# Patient Record
Sex: Female | Born: 1990 | Race: Black or African American | Hispanic: No | Marital: Single | Smoking: Current every day smoker
Health system: Southern US, Community
[De-identification: ages and names within clinical notes are randomized; demographics above are authoritative.]

## PROBLEM LIST (undated history)

## (undated) ENCOUNTER — Emergency Department (HOSPITAL_COMMUNITY): Payer: Medicaid Other

---

## 2019-03-17 ENCOUNTER — Inpatient Hospital Stay (HOSPITAL_COMMUNITY): Payer: Self-pay

## 2019-03-17 ENCOUNTER — Encounter (HOSPITAL_COMMUNITY): Payer: Self-pay | Admitting: Obstetrics and Gynecology

## 2019-03-17 ENCOUNTER — Other Ambulatory Visit: Payer: Self-pay

## 2019-03-17 ENCOUNTER — Inpatient Hospital Stay (HOSPITAL_COMMUNITY)
Admission: AD | Admit: 2019-03-17 | Discharge: 2019-03-17 | Disposition: A | Discharge status: Home / Self Care | Payer: Self-pay | Attending: Obstetrics and Gynecology | Admitting: Obstetrics and Gynecology

## 2019-03-17 DIAGNOSIS — O208 Other hemorrhage in early pregnancy: Secondary | ICD-10-CM | POA: Insufficient documentation

## 2019-03-17 DIAGNOSIS — O26899 Other specified pregnancy related conditions, unspecified trimester: Secondary | ICD-10-CM

## 2019-03-17 DIAGNOSIS — R102 Pelvic and perineal pain: Secondary | ICD-10-CM | POA: Insufficient documentation

## 2019-03-17 DIAGNOSIS — O209 Hemorrhage in early pregnancy, unspecified: Secondary | ICD-10-CM

## 2019-03-17 DIAGNOSIS — M545 Low back pain: Secondary | ICD-10-CM | POA: Insufficient documentation

## 2019-03-17 DIAGNOSIS — Z3A01 Less than 8 weeks gestation of pregnancy: Secondary | ICD-10-CM

## 2019-03-17 LAB — CBC
HCT: 36 % (ref 36.0–46.0)
Hemoglobin: 12.5 g/dL (ref 12.0–15.0)
MCH: 26.5 pg (ref 26.0–34.0)
MCHC: 34.7 g/dL (ref 30.0–36.0)
MCV: 76.4 fL — ABNORMAL LOW (ref 80.0–100.0)
Platelets: 268 10*3/uL (ref 150–400)
RBC: 4.71 MIL/uL (ref 3.87–5.11)
RDW: 15.3 % (ref 11.5–15.5)
WBC: 7.9 10*3/uL (ref 4.0–10.5)
nRBC: 0 % (ref 0.0–0.2)

## 2019-03-17 LAB — HCG, QUANTITATIVE, PREGNANCY: hCG, Beta Chain, Quant, S: 26191 m[IU]/mL — ABNORMAL HIGH (ref <5)

## 2019-03-17 LAB — ABO/RH: ABO/RH(D): A POS

## 2019-03-17 LAB — WET PREP, GENITAL
Sperm: NONE SEEN
Trich, Wet Prep: NONE SEEN
Yeast Wet Prep HPF POC: NONE SEEN

## 2019-03-17 LAB — POCT PREGNANCY, URINE: Preg Test, Ur: POSITIVE — AB

## 2019-03-17 LAB — HIV ANTIBODY (ROUTINE TESTING W REFLEX): HIV Screen 4th Generation wRfx: NONREACTIVE

## 2019-03-17 NOTE — Progress Notes (Signed)
Marie Williams CNM in earlier to discuss test results and d/c plan. WRitten and verbal d/c instructions given and understanding voiced. 

## 2019-03-17 NOTE — MAU Provider Note (Signed)
Chief Complaint: Vaginal Bleeding, Back Pain, and Possible Pregnancy   First Provider Initiated Contact with Patient 03/17/19 2125        SUBJECTIVE HPI: Toni Mitchell is a 29 y.o. No obstetric history on file. at Unknown by LMP who presents to maternity admissions reporting vaginal bleeding with low back pain in light of positive pregnancy test. . She denies vaginal itching/burning, urinary symptoms, h/a, dizziness, n/v, or fever/chills.    Vaginal Bleeding The patient's primary symptoms include vaginal bleeding. The patient's pertinent negatives include no genital itching, genital lesions, genital odor or pelvic pain. This is a new problem. The current episode started today. The problem occurs intermittently. The problem has been unchanged. The pain is mild. Associated symptoms include back pain. Pertinent negatives include no abdominal pain, chills, constipation, diarrhea, dysuria, fever, flank pain, headaches or vomiting. Associated symptoms comments: Back pain. The vaginal discharge was bloody. The vaginal bleeding is lighter than menses. She has not been passing clots. She has not been passing tissue. Nothing aggravates the symptoms. She has tried nothing for the symptoms.  Back Pain This is a new problem. The current episode started today. The problem occurs intermittently. The problem is unchanged. The pain is present in the lumbar spine. Pertinent negatives include no abdominal pain, dysuria, fever, headaches, numbness, paresis or pelvic pain. She has tried nothing for the symptoms.  Possible Pregnancy This is a new problem. The current episode started in the past 7 days. The problem occurs constantly. The problem has been unchanged. Pertinent negatives include no abdominal pain, chills, congestion, fever, headaches, neck pain, numbness or vomiting. Nothing aggravates the symptoms. She has tried nothing for the symptoms.   RN note: Came up from ED. Last wk had 3 +HPT, 2 this week.   Started bleeding last night.  Spots on pad, looked heavier in toilet today. Low back has been hurting since she missed her period  History reviewed. No pertinent past medical history. History reviewed. No pertinent surgical history. Social History   Socioeconomic History  . Marital status: Single    Spouse name: Not on file  . Number of children: Not on file  . Years of education: Not on file  . Highest education level: Not on file  Occupational History  . Not on file  Tobacco Use  . Smoking status: Not on file  Substance and Sexual Activity  . Alcohol use: Not on file  . Drug use: Not on file  . Sexual activity: Not on file  Other Topics Concern  . Not on file  Social History Narrative  . Not on file   Social Determinants of Health   Financial Resource Strain:   . Difficulty of Paying Living Expenses: Not on file  Food Insecurity:   . Worried About Programme researcher, broadcasting/film/video in the Last Year: Not on file  . Ran Out of Food in the Last Year: Not on file  Transportation Needs:   . Lack of Transportation (Medical): Not on file  . Lack of Transportation (Non-Medical): Not on file  Physical Activity:   . Days of Exercise per Week: Not on file  . Minutes of Exercise per Session: Not on file  Stress:   . Feeling of Stress : Not on file  Social Connections:   . Frequency of Communication with Friends and Family: Not on file  . Frequency of Social Gatherings with Friends and Family: Not on file  . Attends Religious Services: Not on file  . Active Member of  Clubs or Organizations: Not on file  . Attends Archivist Meetings: Not on file  . Marital Status: Not on file  Intimate Partner Violence:   . Fear of Current or Ex-Partner: Not on file  . Emotionally Abused: Not on file  . Physically Abused: Not on file  . Sexually Abused: Not on file   No current facility-administered medications on file prior to encounter.   No current outpatient medications on file prior to  encounter.   Not on File  I have reviewed patient's Past Medical Hx, Surgical Hx, Family Hx, Social Hx, medications and allergies.   ROS:  Review of Systems  Constitutional: Negative for chills and fever.  HENT: Negative for congestion.   Gastrointestinal: Negative for abdominal pain, constipation, diarrhea and vomiting.  Genitourinary: Positive for vaginal bleeding. Negative for dysuria, flank pain and pelvic pain.  Musculoskeletal: Positive for back pain. Negative for neck pain.  Neurological: Negative for numbness and headaches.   Review of Systems  Other systems negative   Physical Exam  Physical Exam Patient Vitals for the past 24 hrs:  BP Temp Temp src Pulse Resp SpO2 Height Weight  03/17/19 2325 117/75 -- -- (!) 107 16 -- -- --  03/17/19 1844 122/68 98.9 F (37.2 C) Oral 99 16 99 % 5\' 6"  (1.676 m) 56.6 kg  03/17/19 1811 122/86 98.4 F (36.9 C) Oral (!) 104 16 100 % -- --   Constitutional: Well-developed, well-nourished female in no acute distress.  Cardiovascular: normal rate Respiratory: normal effort GI: Abd soft, non-tender. Pos BS x 4 MS: Extremities nontender, no edema, normal ROM Neurologic: Alert and oriented x 4.  GU: Neg CVAT.  PELVIC EXAM: Cervix pink, visually closed, without lesion, scant red discharge, vaginal walls and external genitalia normal Bimanual exam: Cervix 0/long/high, firm, anterior, neg CMT, uterus nontender, nonenlarged, adnexa without tenderness, enlargement, or mass   LAB RESULTS Results for orders placed or performed during the hospital encounter of 03/17/19 (from the past 24 hour(s))  Pregnancy, urine POC     Status: Abnormal   Collection Time: 03/17/19  7:40 PM  Result Value Ref Range   Preg Test, Ur POSITIVE (A) NEGATIVE  hCG, quantitative, pregnancy     Status: Abnormal   Collection Time: 03/17/19  8:12 PM  Result Value Ref Range   hCG, Beta Chain, Quant, S 26,191 (H) <5 mIU/mL  CBC     Status: Abnormal   Collection Time:  03/17/19  8:12 PM  Result Value Ref Range   WBC 7.9 4.0 - 10.5 K/uL   RBC 4.71 3.87 - 5.11 MIL/uL   Hemoglobin 12.5 12.0 - 15.0 g/dL   HCT 36.0 36.0 - 46.0 %   MCV 76.4 (L) 80.0 - 100.0 fL   MCH 26.5 26.0 - 34.0 pg   MCHC 34.7 30.0 - 36.0 g/dL   RDW 15.3 11.5 - 15.5 %   Platelets 268 150 - 400 K/uL   nRBC 0.0 0.0 - 0.2 %  HIV Antibody (routine testing w rflx)     Status: None   Collection Time: 03/17/19  8:12 PM  Result Value Ref Range   HIV Screen 4th Generation wRfx NON REACTIVE NON REACTIVE  ABO/Rh     Status: None   Collection Time: 03/17/19  8:12 PM  Result Value Ref Range   ABO/RH(D) A POS    No rh immune globuloin      NOT A RH IMMUNE GLOBULIN CANDIDATE, PT RH POSITIVE Performed at [facility]  Lab, [address] [place], [place] [address]   Wet prep, genital     Status: Abnormal   Collection Time: 03/17/19  9:35 PM   Specimen: Vaginal  Result Value Ref Range   Yeast Wet Prep HPF POC NONE SEEN NONE SEEN   Trich, Wet Prep NONE SEEN NONE SEEN   Clue Cells Wet Prep HPF POC PRESENT (A) NONE SEEN   WBC, Wet Prep HPF POC MANY (A) NONE SEEN   Sperm NONE SEEN     --/--/A POS (01/28 2012)  IMAGING US OB Comp Less 14 Wks  Result Date: 03/17/2019 CLINICAL DATA:  Pelvic pain, bleeding EXAM: OBSTETRIC <14 WK [place] AND TRANSVAGINAL OB US TECHNIQUE: Both transabdominal and transvaginal ultrasound examinations were performed for complete evaluation of the gestation as well as the maternal uterus, adnexal regions, and pelvic cul-de-sac. Transvaginal technique was performed to assess early pregnancy. COMPARISON:  None. FINDINGS: Intrauterine gestational sac: Single Yolk sac:  Visualized Embryo:  Not visualized Cardiac Activity: Not visualized Heart Rate:   bpm MSD: 12.1 mm   6 w   0 d CRL:    mm    w    d                  [place] EDC: Subchorionic hemorrhage:  Moderate subchorionic hemorrhage. Maternal uterus/adnexae: No adnexal mass or free fluid. IMPRESSION: Six week intrauterine  gestational sac with yolk sac present, but no fetal pole. Follow-up ultrasound in 14 days recommended to ensure expected progression. Moderate subchorionic hemorrhage. Electronically Signed   By: Charlett Nose M.D.   On: 03/17/2019 22:33   US OB Transvaginal  Result Date: 03/17/2019 CLINICAL DATA:  Pelvic pain, bleeding EXAM: OBSTETRIC <14 WK [place] AND TRANSVAGINAL OB US TECHNIQUE: Both transabdominal and transvaginal ultrasound examinations were performed for complete evaluation of the gestation as well as the maternal uterus, adnexal regions, and pelvic cul-de-sac. Transvaginal technique was performed to assess early pregnancy. COMPARISON:  None. FINDINGS: Intrauterine gestational sac: Single Yolk sac:  Visualized Embryo:  Not visualized Cardiac Activity: Not visualized Heart Rate:   bpm MSD: 12.1 mm   6 w   0 d CRL:    mm    w    d                  [place] EDC: Subchorionic hemorrhage:  Moderate subchorionic hemorrhage. Maternal uterus/adnexae: No adnexal mass or free fluid. IMPRESSION: Six week intrauterine gestational sac with yolk sac present, but no fetal pole. Follow-up ultrasound in 14 days recommended to ensure expected progression. Moderate subchorionic hemorrhage. Electronically Signed   By: Charlett Nose M.D.   On: 03/17/2019 22:33    MAU Management/MDM: Ordered usual first trimester r/o ectopic labs.   Pelvic exam and cultures done Will check baseline Ultrasound to rule out ectopic.  This bleeding/pain can represent a normal pregnancy with bleeding, spontaneous abortion or even an ectopic which can be life-threatening.  The process as listed above helps to determine which of these is present.  Reviewed findings.  Will schedule [place] for followup in about 10 days for viability.  SAB precautions  ASSESSMENT 1. Vaginal bleeding in pregnancy, first trimester   2. Pelvic pain affecting pregnancy     PLAN Discharge home Will repeat  Ultrasound in about 7-10 days   Follow-up Information     [facility] Follow up.   Specialty: Obstetrics and Gynecology Contact information: [address] [place] [address], [address] [id number] mc  [facility] [place] [address] [phone]       [facility] Follow up.   Specialty: Radiology Why: someone will call Contact information: [address], [address] [id number] mc [place] [address] [phone]         Pt stable at time of discharge. Encouraged to return here or to other Urgent Care/ED if she develops worsening of symptoms, increase in pain, fever, or other concerning symptoms.    Wynelle Bourgeois CNM, MSN Certified Nurse-Midwife 03/17/2019  11:36 PM

## 2019-03-17 NOTE — MAU Note (Signed)
Unable to void

## 2019-03-17 NOTE — Discharge Instructions (Signed)

## 2019-03-17 NOTE — MAU Note (Signed)
Came up from ED. Last wk had 3 +HPT, 2 this week.  Started bleeding last night.  Spots on pad, looked heavier in toilet today. Low back has been hurting since she missed her period.

## 2019-03-21 LAB — GC/CHLAMYDIA PROBE AMP (~~LOC~~) NOT AT ARMC
Chlamydia: NEGATIVE
Comment: NEGATIVE
Comment: NORMAL
Neisseria Gonorrhea: NEGATIVE

## 2019-03-24 ENCOUNTER — Other Ambulatory Visit: Payer: Self-pay

## 2019-03-24 ENCOUNTER — Ambulatory Visit (HOSPITAL_COMMUNITY)
Admission: RE | Admit: 2019-03-24 | Discharge: 2019-03-24 | Disposition: A | Discharge status: Home / Self Care | Payer: Self-pay | Source: Ambulatory Visit | Attending: Advanced Practice Midwife | Admitting: Advanced Practice Midwife

## 2019-03-24 ENCOUNTER — Ambulatory Visit (INDEPENDENT_AMBULATORY_CARE_PROVIDER_SITE_OTHER): Payer: Self-pay

## 2019-03-24 ENCOUNTER — Encounter: Payer: Self-pay | Admitting: Family Medicine

## 2019-03-24 DIAGNOSIS — O209 Hemorrhage in early pregnancy, unspecified: Secondary | ICD-10-CM | POA: Insufficient documentation

## 2019-03-24 DIAGNOSIS — O26899 Other specified pregnancy related conditions, unspecified trimester: Secondary | ICD-10-CM | POA: Insufficient documentation

## 2019-03-24 DIAGNOSIS — R102 Pelvic and perineal pain: Secondary | ICD-10-CM | POA: Insufficient documentation

## 2019-03-24 DIAGNOSIS — Z3491 Encounter for supervision of normal pregnancy, unspecified, first trimester: Secondary | ICD-10-CM

## 2019-03-24 NOTE — Progress Notes (Signed)
Agree with A & P. 

## 2019-03-24 NOTE — Progress Notes (Signed)
Pt here today for OB US results.  US shows viable pregnancy with moderate subchronic hemorrhage.  FHR 120 with EDD 11/16/19 and 6w 1d today.   [place] pics given to pt.  Medications/allergies reviewed.  Proof of pregnancy letter provided by front office for pt to begin prenatal care.    Addison Naegeli, RN 03/24/19

## 2019-09-19 ENCOUNTER — Emergency Department (HOSPITAL_COMMUNITY)
Admission: EM | Admit: 2019-09-19 | Discharge: 2019-09-19 | Disposition: A | Discharge status: Home / Self Care | Payer: Self-pay | Attending: Emergency Medicine | Admitting: Emergency Medicine

## 2019-09-19 ENCOUNTER — Other Ambulatory Visit: Payer: Self-pay

## 2019-09-19 ENCOUNTER — Emergency Department (HOSPITAL_COMMUNITY): Payer: Self-pay

## 2019-09-19 DIAGNOSIS — R102 Pelvic and perineal pain: Secondary | ICD-10-CM | POA: Insufficient documentation

## 2019-09-19 DIAGNOSIS — R109 Unspecified abdominal pain: Secondary | ICD-10-CM

## 2019-09-19 DIAGNOSIS — M545 Low back pain: Secondary | ICD-10-CM | POA: Insufficient documentation

## 2019-09-19 DIAGNOSIS — R300 Dysuria: Secondary | ICD-10-CM | POA: Insufficient documentation

## 2019-09-19 DIAGNOSIS — N898 Other specified noninflammatory disorders of vagina: Secondary | ICD-10-CM | POA: Insufficient documentation

## 2019-09-19 DIAGNOSIS — R110 Nausea: Secondary | ICD-10-CM | POA: Insufficient documentation

## 2019-09-19 DIAGNOSIS — N72 Inflammatory disease of cervix uteri: Secondary | ICD-10-CM | POA: Insufficient documentation

## 2019-09-19 LAB — URINALYSIS, ROUTINE W REFLEX MICROSCOPIC
Bacteria, UA: NONE SEEN
Bilirubin Urine: NEGATIVE
Glucose, UA: NEGATIVE mg/dL
Ketones, ur: NEGATIVE mg/dL
Leukocytes,Ua: NEGATIVE
Nitrite: NEGATIVE
Protein, ur: NEGATIVE mg/dL
Specific Gravity, Urine: 1.035 — ABNORMAL HIGH (ref 1.005–1.030)
pH: 5 (ref 5.0–8.0)

## 2019-09-19 LAB — CBC
HCT: 33.5 % — ABNORMAL LOW (ref 36.0–46.0)
Hemoglobin: 11.4 g/dL — ABNORMAL LOW (ref 12.0–15.0)
MCH: 27.5 pg (ref 26.0–34.0)
MCHC: 34 g/dL (ref 30.0–36.0)
MCV: 80.9 fL (ref 80.0–100.0)
Platelets: 228 10*3/uL (ref 150–400)
RBC: 4.14 MIL/uL (ref 3.87–5.11)
RDW: 15.3 % (ref 11.5–15.5)
WBC: 8.1 10*3/uL (ref 4.0–10.5)
nRBC: 0 % (ref 0.0–0.2)

## 2019-09-19 LAB — COMPREHENSIVE METABOLIC PANEL WITH GFR
ALT: 13 U/L (ref 0–44)
AST: 15 U/L (ref 15–41)
Albumin: 3.8 g/dL (ref 3.5–5.0)
Alkaline Phosphatase: 48 U/L (ref 38–126)
Anion gap: 9 (ref 5–15)
BUN: 8 mg/dL (ref 6–20)
CO2: 22 mmol/L (ref 22–32)
Calcium: 8.8 mg/dL — ABNORMAL LOW (ref 8.9–10.3)
Chloride: 108 mmol/L (ref 98–111)
Creatinine, Ser: 0.81 mg/dL (ref 0.44–1.00)
GFR calc Af Amer: >60 mL/min
GFR calc non Af Amer: >60 mL/min
Glucose, Bld: 108 mg/dL — ABNORMAL HIGH (ref 70–99)
Potassium: 3.4 mmol/L — ABNORMAL LOW (ref 3.5–5.1)
Sodium: 139 mmol/L (ref 135–145)
Total Bilirubin: 0.2 mg/dL — ABNORMAL LOW (ref 0.3–1.2)
Total Protein: 6.5 g/dL (ref 6.5–8.1)

## 2019-09-19 LAB — WET PREP, GENITAL
Clue Cells Wet Prep HPF POC: NONE SEEN
Sperm: NONE SEEN
Trich, Wet Prep: NONE SEEN
Yeast Wet Prep HPF POC: NONE SEEN

## 2019-09-19 LAB — LIPASE, BLOOD: Lipase: 21 U/L (ref 11–51)

## 2019-09-19 LAB — I-STAT BETA HCG BLOOD, ED (MC, WL, AP ONLY): I-stat hCG, quantitative: <5 m[IU]/mL (ref <5)

## 2019-09-19 MED ORDER — LIDOCAINE HCL (PF) 1 % IJ SOLN
INTRAMUSCULAR | Status: AC
  Administered 2019-09-19: 1 mL
  Filled 2019-09-19: qty 5

## 2019-09-19 MED ORDER — KETOROLAC TROMETHAMINE 60 MG/2ML IM SOLN
60.0000 mg | Freq: Once | INTRAMUSCULAR | Status: AC
  Administered 2019-09-19: 60 mg via INTRAMUSCULAR
  Filled 2019-09-19: qty 2

## 2019-09-19 MED ORDER — POTASSIUM CHLORIDE CRYS ER 20 MEQ PO TBCR
40.0000 meq | EXTENDED_RELEASE_TABLET | Freq: Once | ORAL | Status: AC
  Administered 2019-09-19: 40 meq via ORAL
  Filled 2019-09-19: qty 2

## 2019-09-19 MED ORDER — CEFTRIAXONE SODIUM 500 MG IJ SOLR
500.0000 mg | Freq: Once | INTRAMUSCULAR | Status: AC
  Administered 2019-09-19: 500 mg via INTRAMUSCULAR
  Filled 2019-09-19: qty 500

## 2019-09-19 MED ORDER — DOXYCYCLINE HYCLATE 100 MG PO TABS
100.0000 mg | ORAL_TABLET | Freq: Once | ORAL | Status: AC
  Administered 2019-09-19: 100 mg via ORAL
  Filled 2019-09-19: qty 1

## 2019-09-19 MED ORDER — DOXYCYCLINE HYCLATE 100 MG PO CAPS
100.0000 mg | ORAL_CAPSULE | Freq: Two times a day (BID) | ORAL | 0 refills | Status: AC
Start: 2019-09-19 — End: 2019-09-26

## 2019-09-19 NOTE — Discharge Instructions (Signed)
You have been treated today for an STD.   Take antibiotics as directed. Please take all of your antibiotics until finished.  The test results with take 2-3 days to return. If there is an abnormal result, you will be notified. If you do not hear anything, that means the results were negative. You can also log on MyChart to see the results.   Your sexual partner needs to be treated too. Do not have sexual intercourse for the next 7 days and after your partner has been treated.   Your urine has been sent for culture.  If it is positive for infection, you will be notified  Follow-up with your primary care doctor in 2-4 days. If you do not have a primary care doctor, you can use one listed in the paperwork.   Return to the Emergency Department for any fever, abdominal pain, difficulty breathing, nausea/vomiting or any other worsening or concerning symptoms.

## 2019-09-19 NOTE — ED Notes (Signed)
Discharge papers discussed; pt educated about abx and need to finish entire course of abx. Pt verbalized understanding. Pt and VS stable upon departure.

## 2019-09-19 NOTE — ED Triage Notes (Signed)
Pt reports generalized abdominal pain with nausea and back pain x several days and headache that started today. Reports burning with urination.

## 2019-09-19 NOTE — ED Provider Notes (Signed)
[facility] EMERGENCY DEPARTMENT Provider Note   CSN: [id number] Arrival date & time: 09/19/19  1624     History Chief Complaint  Patient presents with  . Abdominal Pain    Toni Mitchell is a 29 y.o. female who presents for evaluation of abdominal pain and dysuria that has been ongoing for last few days.  She states that she has some irritation in the vaginal area, particular when she urinates.  She feels like it is irritated.  She has not seen any rash.  She states that she has a little bit of discharge which he states is common for her before her cycle.  Her LMP was 09/01/2019.  No vaginal bleeding.  She reports that she has had some lower abdominal pain.  She states it has been constant over the last few days.  She also is reported some lower back pain.  She describes it as a cramping type situation.  She has been taking ibuprofen which has not been helping.  She has also been applying heat.  She has had some nausea but no vomiting.  She states that she has not had any fevers, diarrhea, constipation, chest pain, difficulty breathing.  She is currently sexually active with 1 partner.  They do not use protection.  The history is provided by the patient.       No past medical history on file.  There are no problems to display for this patient.   No past surgical history on file.   OB History   No obstetric history on file.     No family history on file.  Social History   Tobacco Use  . Smoking status: Not on file  Substance Use Topics  . Alcohol use: Not on file  . Drug use: Not on file    Home Medications Prior to Admission medications   Medication Sig Start Date End Date Taking? Authorizing Provider  saccharomyces boulardii (FLORASTOR) 250 MG capsule Take 500 mg by mouth daily.   Yes [provider]  doxycycline (VIBRAMYCIN) 100 MG capsule Take 1 capsule (100 mg total) by mouth 2 (two) times daily for 7 days. 09/19/19 09/26/19  Maxwell Caul, PA-C    Allergies    Shellfish allergy  Review of Systems   Review of Systems  Constitutional: Negative for fever.  Respiratory: Negative for cough and shortness of breath.   Cardiovascular: Negative for chest pain.  Gastrointestinal: Positive for abdominal pain. Negative for nausea and vomiting.  Genitourinary: Positive for dysuria and vaginal discharge. Negative for hematuria.  Neurological: Negative for headaches.  All other systems reviewed and are negative.   Physical Exam Updated Vital Signs BP 116/83 (BP Location: Left Arm)   Pulse (!) 59   Temp 98.2 F (36.8 C) (Oral)   Resp 16   Ht 5\' 6"  (1.676 m)   Wt 59 kg   LMP 09/01/2019 (Exact Date)   SpO2 100%   BMI 20.98 kg/m   Physical Exam Vitals and nursing note reviewed. Exam conducted with a chaperone present.  Constitutional:      Appearance: Normal appearance. She is well-developed.  HENT:     Head: Normocephalic and atraumatic.  Eyes:     General: Lids are normal.     Conjunctiva/sclera: Conjunctivae normal.     Pupils: Pupils are equal, round, and reactive to light.  Cardiovascular:     Rate and Rhythm: Normal rate and regular rhythm.     Pulses: Normal pulses.  Heart sounds: Normal heart sounds. No murmur heard.  No friction rub. No gallop.   Pulmonary:     Effort: Pulmonary effort is normal.     Breath sounds: Normal breath sounds.     Comments: Lungs clear to auscultation bilaterally.  Symmetric chest rise.  No wheezing, rales, rhonchi. Abdominal:     Palpations: Abdomen is soft. Abdomen is not rigid.     Tenderness: There is abdominal tenderness in the suprapubic area. There is right CVA tenderness and left CVA tenderness. There is no guarding.     Comments: Abdomen is soft, non-distended.  Mild tenderness noted suprapubic region.  No rigidity, guarding.  Mild CVA tenderness noted bilaterally.  No rigidity, No guarding. No peritoneal signs.  Genitourinary:    Vagina: Vaginal discharge  present.     Cervix: Friability and erythema present. No cervical motion tenderness.     Adnexa:        Right: No mass or tenderness.         Left: No mass.       Comments: The exam was performed with a chaperone present. Normal external female genitalia. No lesions, rash, or sores.  Mild cervical erythema, friability.  No CMT.  Thick white discharge noted in vaginal vault.  No adnexal mass or tenderness noted bilaterally. Musculoskeletal:        General: Normal range of motion.     Cervical back: Full passive range of motion without pain.  Skin:    General: Skin is warm and dry.     Capillary Refill: Capillary refill takes less than 2 seconds.  Neurological:     Mental Status: She is alert and oriented to person, place, and time.  Psychiatric:        Speech: Speech normal.     ED Results / Procedures / Treatments   Labs (all labs ordered are listed, but only abnormal results are displayed) Labs Reviewed  WET PREP, GENITAL - Abnormal; Notable for the following components:      Result Value   WBC, Wet Prep HPF POC MODERATE (*)    All other components within normal limits  COMPREHENSIVE METABOLIC PANEL - Abnormal; Notable for the following components:   Potassium 3.4 (*)    Glucose, Bld 108 (*)    Calcium 8.8 (*)    Total Bilirubin 0.2 (*)    All other components within normal limits  CBC - Abnormal; Notable for the following components:   Hemoglobin 11.4 (*)    HCT 33.5 (*)    All other components within normal limits  URINALYSIS, ROUTINE W REFLEX MICROSCOPIC - Abnormal; Notable for the following components:   Specific Gravity, Urine 1.035 (*)    Hgb urine dipstick SMALL (*)    All other components within normal limits  URINE CULTURE  LIPASE, BLOOD  I-STAT BETA HCG BLOOD, ED ([facility], [facility], [facility] ONLY)  GC/CHLAMYDIA PROBE AMP ([place]) NOT AT [facility]    EKG None  Radiology CT Renal Stone Study  Result Date: 09/19/2019 CLINICAL DATA:  Left flank pain, stone disease suspected  EXAM: CT ABDOMEN AND PELVIS WITHOUT CONTRAST TECHNIQUE: Multidetector CT imaging of the abdomen and pelvis was performed following the standard protocol without IV contrast. COMPARISON:  Obstetrical ultrasound, most recently 03/24/2019 FINDINGS: Lower chest: Lung bases are clear. Normal heart size. No pericardial effusion. Hepatobiliary: No visible focal liver lesion on this unenhanced CT. Normal liver attenuation. Smooth liver surface contour. Gallbladder partially decompressed but otherwise unremarkable. No biliary ductal dilatation.  No visible calcified gallstones. Pancreas: Unremarkable. No pancreatic ductal dilatation or surrounding inflammatory changes. Spleen: Normal in size. No concerning splenic lesions. Adrenals/Urinary Tract: Normal adrenal glands. No visible or contour deforming worrisome renal lesions. No urolithiasis or hydronephrosis is evident. No visible calcifications along the course of either ureter. Urinary bladder is largely decompressed at the time of exam and therefore poorly evaluated by CT imaging. No gross bladder abnormality, visible bladder or urethral calculi are identified Stomach/Bowel: Distal esophagus, stomach and duodenal sweep are unremarkable. No small bowel wall thickening or dilatation. Fecalization of the distal small bowel contents without evidence of mechanical obstruction. No colonic dilatation or wall thickening. A normal appendix is visualized. Vascular/Lymphatic: No significant vascular findings are present. No enlarged abdominal or pelvic lymph nodes. Reproductive: Anteverted uterus. Normal follicles present in both ovaries including a dominant follicle in the right ovary. No concerning adnexal lesions. Other: No abdominopelvic free fluid or free gas. No bowel containing hernias. Musculoskeletal: No acute osseous abnormality or suspicious osseous lesion. 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 Nonspecific. IMPRESSION: 1. No acute urinary tract abnormality is  evident. Specifically no evidence of urolithiasis or hydronephrosis. 2. Fecalization of the distal small bowel contents without evidence of mechanical obstruction. Findings are nonspecific but can be seen with slowed intestinal transit/constipation. 3. 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 Electronically Signed   By: Kreg ShropshirePrice  DeHay M.D.   On: 09/19/2019 21:59    Procedures Procedures (including critical care time)  Medications Ordered in ED Medications  potassium chloride SA (KLOR-CON) CR tablet 40 mEq (has no administration in time range)  cefTRIAXone (ROCEPHIN) injection 500 mg (has no administration in time range)  doxycycline (VIBRA-TABS) tablet 100 mg (has no administration in time range)  ketorolac (TORADOL) injection 60 mg (60 mg Intramuscular Given 09/19/19 2135)    ED Course  I have reviewed the triage vital signs and the nursing notes.  Pertinent labs & imaging results that were available during my care of the patient were reviewed by me and considered in my medical decision making (see chart for details).    MDM Rules/Calculators/A&P                          29 year old female who presents for evaluation of dysuria, vaginal irritation, vaginal discharge, abdominal pain that is been ongoing for the last few days.  Associated with some nausea.  No fevers, vomiting.  On initially arrival, she is afebrile, nontoxic-appearing.  Vital signs are stable.  On exam, she has some mild suprapubic abdominal tenderness as well as some mild CVA tenderness.  Concern for GU etiology versus infectious etiology.  History/physical exam not concerning for ovarian torsion.  Plan to check labs.  We will also obtain pelvic.  I-STAT beta negative.  UA shows small hemoglobin.  CMP shows potassium of 3.4.  BUN creatinine within normal limits.  Lipase is unremarkable.  CBC shows no leukocytosis.  Hemoglobin stable at 11.4.  We will plan to send urine for culture. Given hgb in urine plan for renal  study for eval stones.   Pelvic exam as documented above.  Patient tolerated procedure well.  Exam not concerning for PID.  She did have some cervical erythema and friability as well as discharge noted in the vaginal vault.  Wet prep and gonorrhea/chlamydia sent. No indication for U/S.   CT unremarkable for any evidence of kidney stones.  No evidence of inflammation surrounding kidney symmetry concerning for pad of  redness.  Given that she is symptomatic, urine sent for culture.  I discussed results with patient.  She is resting comfortably.  She states that Toradol helped her pain here in the department.  Patient states she is ready to go home.  I discussed with patient regarding her pelvic exam and pending gonorrhea/chlamydia cultures.  Patient would like to opt for treatment.  Patient with no known drug allergies.  Plan for treatment for gonorrhea/chlamydria. At this time, patient exhibits no emergent life-threatening condition that require further evaluation in ED or admission. Patient had ample opportunity for questions and discussion. All patient's questions were answered with full understanding. Strict return precautions discussed. Patient expresses understanding and agreement to plan.   Portions of this note were generated with Scientist, clinical (histocompatibility and immunogenetics). Dictation errors may occur despite best attempts at proofreading.    Final Clinical Impression(s) / ED Diagnoses Final diagnoses:  Abdominal pain, unspecified abdominal location    Rx / DC Orders ED Discharge Orders         Ordered    doxycycline (VIBRAMYCIN) 100 MG capsule  2 times daily     Discontinue  Reprint     09/19/19 2212           Maxwell Caul, PA-C 09/19/19 2216    Rolan Bucco, MD 09/19/19 [phone]

## 2019-09-20 LAB — GC/CHLAMYDIA PROBE AMP (~~LOC~~) NOT AT ARMC
Chlamydia: NEGATIVE
Comment: NEGATIVE
Comment: NORMAL
Neisseria Gonorrhea: NEGATIVE

## 2019-09-22 LAB — URINE CULTURE: Culture: 20000 — AB

## 2019-09-24 ENCOUNTER — Telehealth: Payer: Self-pay | Admitting: Emergency Medicine

## 2019-09-24 NOTE — Telephone Encounter (Signed)
Post ED Visit - Positive Culture Follow-up  Culture report reviewed by antimicrobial stewardship pharmacist: [facility] []  , Pharm.D. []  Enzo Bi, Pharm.D., BCPS AQ-ID []  , Pharm.D., BCPS []  Celedonio Miyamoto, Pharm.D., BCPS []  [place], Garvin Fila.D., BCPS, AAHIVP []  , Pharm.D., BCPS, AAHIVP []  Georgina Pillion, PharmD, BCPS []  , PharmD, BCPS []  [place], PharmD, BCPS [x]  [address], PharmD []  , PharmD, BCPS []  Estella Husk, PharmD  Pharmacy Team []  Lysle Pearl, PharmD []  , PharmD []  Phillips Climes, PharmD []  , Rph []  Agapito Games) , PharmD []  Lamar Sprinkles, PharmD []  , PharmD []  Mervyn Gay, PharmD []  , PharmD []  Vinnie Level, PharmD []  W[facility], PharmD []  , PharmD []  Len Childs, PharmD   Positive urine culture Treated with Doxycycline, organism sensitive to the same and no further patient follow-up is required at this time.  Toni Mitchell 09/24/2019, 6:10 PM

## 2020-06-15 ENCOUNTER — Emergency Department (HOSPITAL_COMMUNITY)
Admission: EM | Admit: 2020-06-15 | Discharge: 2020-06-16 | Disposition: A | Discharge status: Home / Self Care | Payer: Medicaid Other | Attending: Emergency Medicine | Admitting: Emergency Medicine

## 2020-06-15 ENCOUNTER — Other Ambulatory Visit: Payer: Self-pay

## 2020-06-15 DIAGNOSIS — R21 Rash and other nonspecific skin eruption: Secondary | ICD-10-CM | POA: Diagnosis not present

## 2020-06-15 DIAGNOSIS — R1084 Generalized abdominal pain: Secondary | ICD-10-CM | POA: Diagnosis not present

## 2020-06-15 DIAGNOSIS — N898 Other specified noninflammatory disorders of vagina: Secondary | ICD-10-CM | POA: Diagnosis present

## 2020-06-15 DIAGNOSIS — B9689 Other specified bacterial agents as the cause of diseases classified elsewhere: Secondary | ICD-10-CM | POA: Diagnosis not present

## 2020-06-15 DIAGNOSIS — N760 Acute vaginitis: Secondary | ICD-10-CM | POA: Diagnosis not present

## 2020-06-15 LAB — URINALYSIS, ROUTINE W REFLEX MICROSCOPIC
Bacteria, UA: NONE SEEN
Bilirubin Urine: NEGATIVE
Glucose, UA: NEGATIVE mg/dL
Ketones, ur: NEGATIVE mg/dL
Leukocytes,Ua: NEGATIVE
Nitrite: NEGATIVE
Protein, ur: 30 mg/dL — AB
Specific Gravity, Urine: 1.032 — ABNORMAL HIGH (ref 1.005–1.030)
pH: 5 (ref 5.0–8.0)

## 2020-06-15 LAB — CBC WITH DIFFERENTIAL/PLATELET
Abs Immature Granulocytes: 0.02 10*3/uL (ref 0.00–0.07)
Basophils Absolute: 0 10*3/uL (ref 0.0–0.1)
Basophils Relative: 0 %
Eosinophils Absolute: 0.4 10*3/uL (ref 0.0–0.5)
Eosinophils Relative: 4 %
HCT: 38.4 % (ref 36.0–46.0)
Hemoglobin: 13.6 g/dL (ref 12.0–15.0)
Immature Granulocytes: 0 %
Lymphocytes Relative: 39 %
Lymphs Abs: 3.5 10*3/uL (ref 0.7–4.0)
MCH: 30.8 pg (ref 26.0–34.0)
MCHC: 35.4 g/dL (ref 30.0–36.0)
MCV: 86.9 fL (ref 80.0–100.0)
Monocytes Absolute: 0.8 10*3/uL (ref 0.1–1.0)
Monocytes Relative: 8 %
Neutro Abs: 4.3 10*3/uL (ref 1.7–7.7)
Neutrophils Relative %: 49 %
Platelets: 246 10*3/uL (ref 150–400)
RBC: 4.42 MIL/uL (ref 3.87–5.11)
RDW: 13 % (ref 11.5–15.5)
WBC: 8.9 10*3/uL (ref 4.0–10.5)
nRBC: 0 % (ref 0.0–0.2)

## 2020-06-15 LAB — COMPREHENSIVE METABOLIC PANEL
ALT: 21 U/L (ref 0–44)
AST: 20 U/L (ref 15–41)
Albumin: 4.2 g/dL (ref 3.5–5.0)
Alkaline Phosphatase: 56 U/L (ref 38–126)
Anion gap: 7 (ref 5–15)
BUN: 10 mg/dL (ref 6–20)
CO2: 25 mmol/L (ref 22–32)
Calcium: 9.1 mg/dL (ref 8.9–10.3)
Chloride: 103 mmol/L (ref 98–111)
Creatinine, Ser: 0.73 mg/dL (ref 0.44–1.00)
GFR, Estimated: >60 mL/min (ref >60)
Glucose, Bld: 84 mg/dL (ref 70–99)
Potassium: 3.7 mmol/L (ref 3.5–5.1)
Sodium: 135 mmol/L (ref 135–145)
Total Bilirubin: 0.6 mg/dL (ref 0.3–1.2)
Total Protein: 7.2 g/dL (ref 6.5–8.1)

## 2020-06-15 LAB — I-STAT BETA HCG BLOOD, ED (MC, WL, AP ONLY): I-stat hCG, quantitative: <5 m[IU]/mL (ref <5)

## 2020-06-15 LAB — HIV ANTIBODY (ROUTINE TESTING W REFLEX): HIV Screen 4th Generation wRfx: NONREACTIVE

## 2020-06-15 NOTE — ED Triage Notes (Signed)
Emergency Medicine Provider Triage Evaluation Note  Toni Mitchell , a 30 y.o. female  was evaluated in triage.  Pt complains of abd pain, vag irritation, rash.  Review of Systems  Positive: abd pain, vaginal irritation, rash Negative: Fever, urinary sxs  Physical Exam  BP 121/89 (BP Location: Left Arm)   Pulse 88   Temp 98 F (36.7 C) (Oral)   Resp 18   Ht 5\' 6"  (1.676 m)   Wt 62.1 kg   SpO2 100%   BMI 22.11 kg/m  Gen:   Awake, no distress, eczematous rash to the posterior right knee HEENT:  Atraumatic  Resp:  Normal effort  Cardiac:  Normal rate  Abd:   Nondistended, nontender  MSK:   Moves extremities without difficulty  Neuro:  Speech clear   Medical Decision Making  Medically screening exam initiated at 9:00 PM.  Appropriate orders placed.  was informed that the remainder of the evaluation will be completed by another provider, this initial triage assessment does not replace that evaluation, and the importance of remaining in the ED until their evaluation is complete.  Clinical Impression   MSE was initiated and I personally evaluated the patient and placed orders (if any) at  9:00 PM on June 15, 2020.  The patient appears stable so that the remainder of the MSE may be completed by another provider.    Jun 17, 2020, PA-C 06/15/20 2100

## 2020-06-16 LAB — WET PREP, GENITAL
Sperm: NONE SEEN
Trich, Wet Prep: NONE SEEN
Yeast Wet Prep HPF POC: NONE SEEN

## 2020-06-16 LAB — RPR: RPR Ser Ql: NONREACTIVE

## 2020-06-16 MED ORDER — METRONIDAZOLE 500 MG PO TABS
500.0000 mg | ORAL_TABLET | Freq: Two times a day (BID) | ORAL | 0 refills | Status: AC
Start: 2020-06-16 — End: 2020-06-23

## 2020-06-16 NOTE — ED Notes (Signed)
E-signature pad unavailable at time of pt discharge. This RN discussed discharge materials with pt and answered all pt questions. Pt stated understanding of discharge material. ? ?

## 2020-06-16 NOTE — ED Provider Notes (Signed)
[facility]EM[facility]Y [facility]PT [facility] Emergency Department Provider Note MRN:  [id number]  Arrival date & time: 06/16/20     Chief Complaint   Vaginal irritation History of Present Illness   Toni Mitchell is a 30 y.o. year-old female with no pertinent past medical history presenting to the ED with chief complaint of vaginal irritation.  Vaginal irritation for the past few days, also with some generalized abdominal discomfort.  Noting some vaginal discharge, vaginal pain.  Sexually active.  Denies fever.  No chest pain or shortness of breath.  Also has been having rash behind the knees bilaterally for a few weeks.  Review of Systems  A complete 10 system review of systems was obtained and all systems are negative except as noted in the HPI and PMH.   Patient's Health History   No past medical history on file.  No past surgical history on file.  No family history on file.  Social History   Socioeconomic History  . Marital status: Single    Spouse name: Not on file  . Number of children: Not on file  . Years of education: Not on file  . Highest education level: Not on file  Occupational History  . Not on file  Tobacco Use  . Smoking status: Not on file  . Smokeless tobacco: Not on file  Substance and Sexual Activity  . Alcohol use: Not on file  . Drug use: Not on file  . Sexual activity: Not on file  Other Topics Concern  . Not on file  Social History Narrative  . Not on file   Social Determinants of Health   Financial Resource Strain: Not on file  Food Insecurity: Not on file  Transportation Needs: Not on file  Physical Activity: Not on file  Stress: Not on file  Social Connections: Not on file  Intimate Partner Violence: Not on file     Physical Exam   Vitals:   06/16/20 0115 06/16/20 0130  BP: 116/77 127/88  Pulse: 79 74  Resp:  18  Temp:    SpO2: 100% 100%    CONSTITUTIONAL: Well-appearing, NAD NEURO:  Alert and oriented x 3, no focal  deficits EYES:  eyes equal and reactive ENT/NECK:  no LAD, no JVD CARDIO: Regular rate, well-perfused, normal S1 and S2 PULM:  CTAB no wheezing or rhonchi GI/GU:  normal bowel sounds, non-distended, non-tender; pelvic exam chaperoned by female nurse, no adnexal tenderness or masses, no cervical motion tenderness, moderate amount of white discharge in the vaginal vault MSK/SPINE:  No gross deformities, no edema SKIN: Dry papular rash to popliteal fossa bilaterally PSYCH:  Appropriate speech and behavior  *Additional and/or pertinent findings included in MDM below  Diagnostic and Interventional Summary    EKG Interpretation  Date/Time:    Ventricular Rate:    PR Interval:    QRS Duration:   QT Interval:    QTC Calculation:   R Axis:     Text Interpretation:        Labs Reviewed  WET PREP, GENITAL - Abnormal; Notable for the following components:      Result Value   Clue Cells Wet Prep HPF POC PRESENT (*)    WBC, Wet Prep HPF POC MANY (*)    All other components within normal limits  URINALYSIS, ROUTINE W REFLEX MICROSCOPIC - Abnormal; Notable for the following components:   Color, Urine AMBER (*)    APPearance HAZY (*)    Specific Gravity, Urine 1.032 (*)  Hgb urine dipstick MODERATE (*)    Protein, ur 30 (*)    All other components within normal limits  CBC WITH DIFFERENTIAL/PLATELET  COMPREHENSIVE METABOLIC PANEL  HIV ANTIBODY (ROUTINE TESTING W REFLEX)  RPR  I-STAT BETA HCG BLOOD, ED ([facility], [facility], [facility] ONLY)  POC URINE PREG, ED  GC/CHLAMYDIA PROBE AMP ([place]) NOT AT [facility]    No orders to display    Medications - No data to display   Procedures  /  Critical Care Procedures  ED Course and Medical Decision Making  I have reviewed the triage vital signs, the nursing notes, and pertinent available records from the EMR.  Listed above are laboratory and imaging tests that I personally ordered, reviewed, and interpreted and then considered in my medical decision  making (see below for details).  Suspect GYN etiology such as BV, yeast infection, STI.  Abdomen soft and nontender with no rebound guarding or rigidity.  Vital signs are normal.  No McBurney's point tenderness.  Awaiting wet prep.     Clue cells present, appropriate for discharge.  Elmer Sow. Pilar Plate, MD [facility] Emergency Medicine [facility] [email or web address]  Final Clinical Impressions(s) / ED Diagnoses     ICD-10-CM   1. BV (bacterial vaginosis)  N76.0    B96.89     ED Discharge Orders    None       Discharge Instructions Discussed with and Provided to Patient:     Discharge Instructions     You were evaluated in the Emergency Department and after careful evaluation, we did not find any emergent condition requiring admission or further testing in the hospital.  Your exam/testing today was overall reassuring.  Symptoms seem to be due to bacterial vaginosis.  Please take the Flagyl medication as directed.  Avoid alcohol while on this medicine.  Please return to the Emergency Department if you experience any worsening of your condition.  Thank you for allowing [place] to be a part of your care.        Sabas Sous, MD 06/16/20 [phone]

## 2020-06-16 NOTE — Discharge Instructions (Addendum)
You were evaluated in the Emergency Department and after careful evaluation, we did not find any emergent condition requiring admission or further testing in the hospital.  Your exam/testing today was overall reassuring.  Symptoms seem to be due to bacterial vaginosis.  Please take the Flagyl medication as directed.  Avoid alcohol while on this medicine.  Please return to the Emergency Department if you experience any worsening of your condition.  Thank you for allowing [place] to be a part of your care.

## 2020-06-18 LAB — GC/CHLAMYDIA PROBE AMP (~~LOC~~) NOT AT ARMC
Chlamydia: NEGATIVE
Comment: NEGATIVE
Comment: NORMAL
Neisseria Gonorrhea: NEGATIVE

## 2020-12-18 DIAGNOSIS — Z419 Encounter for procedure for purposes other than remedying health state, unspecified: Secondary | ICD-10-CM | POA: Diagnosis not present

## 2021-01-17 DIAGNOSIS — Z419 Encounter for procedure for purposes other than remedying health state, unspecified: Secondary | ICD-10-CM | POA: Diagnosis not present

## 2021-02-17 DIAGNOSIS — Z419 Encounter for procedure for purposes other than remedying health state, unspecified: Secondary | ICD-10-CM | POA: Diagnosis not present

## 2021-02-27 ENCOUNTER — Telehealth: Payer: Medicaid Other | Admitting: Physician Assistant

## 2021-02-27 DIAGNOSIS — N898 Other specified noninflammatory disorders of vagina: Secondary | ICD-10-CM

## 2021-02-27 NOTE — Progress Notes (Signed)
Based on what you shared with me, I feel your condition warrants further evaluation and I recommend that you be seen in a face to face visit. Giving the back and belly pain along with yellow/green discharge, you should seek an in-person assessment for examination and testing to make sure proper cause of symptoms is found and proper treatment is given. Pain and this coloration of discharge is not common with a typical yeast infection or BV.    NOTE: There will be NO CHARGE for this eVisit   If you are having a true medical emergency please call 911.      For an urgent face to face visit, [place] has six urgent care centers for your convenience:     [facility] Urgent Care Center at [facility] Directions [phone] [address] [place], [place] [address]    [facility] Urgent Care Center [facility]) Get Driving Directions [phone] [address] [place], [place] [address]  [facility] Health Urgent Care Center [facility]) Get Driving Directions [phone] [address] [place],  [place]  [address]  [facility] Health Urgent Care at [facility] Get Driving Directions [phone] [address] [place], [place] [address]   [facility] Health Urgent Care at [facility] Get Driving Directions  [phone] [address] [place], [place] [address]   [facility] Health Urgent Care at [facility] Directions [phone] [address] [place], [place] [address]  Your MyChart E-visit questionnaire answers were reviewed by a board certified advanced clinical practitioner to complete your personal care plan based on your specific symptoms.  Thank you for using e-Visits.

## 2021-03-03 ENCOUNTER — Telehealth: Payer: Medicaid Other | Admitting: Emergency Medicine

## 2021-03-03 DIAGNOSIS — N898 Other specified noninflammatory disorders of vagina: Secondary | ICD-10-CM

## 2021-03-03 MED ORDER — METRONIDAZOLE 500 MG PO TABS: 500.0000 mg | ORAL_TABLET | Freq: Two times a day (BID) | ORAL | 0 refills | Status: DC

## 2021-03-03 NOTE — Progress Notes (Signed)
I have spent 5 minutes in review of e-visit questionnaire, review and updating patient chart, medical decision making and response to patient.   Ashleyanne Hemmingway, PA-C    

## 2021-03-03 NOTE — Progress Notes (Signed)

## 2021-03-20 DIAGNOSIS — Z419 Encounter for procedure for purposes other than remedying health state, unspecified: Secondary | ICD-10-CM | POA: Diagnosis not present

## 2021-04-17 DIAGNOSIS — Z419 Encounter for procedure for purposes other than remedying health state, unspecified: Secondary | ICD-10-CM | POA: Diagnosis not present

## 2021-05-16 IMAGING — US US OB COMP LESS 14 WK
1 series · 15 of 28 positions shown · non-contrast
Comparison: None.

CLINICAL DATA: Pelvic pain, bleeding

EXAM:
OBSTETRIC <14 WK US AND TRANSVAGINAL OB US
TECHNIQUE: Both transabdominal and transvaginal ultrasound examinations were
performed for complete evaluation of the gestation as well as the
maternal uterus, adnexal regions, and pelvic cul-de-sac.
Transvaginal technique was performed to assess early pregnancy.

[Series 1: us ob comp less 14 wk · 51 acquisitions, 15 frames shown]
[im 1/51]
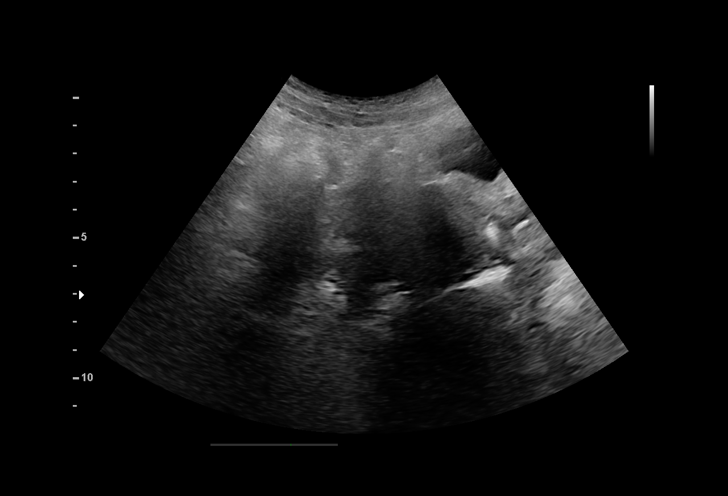
[im 4/51]
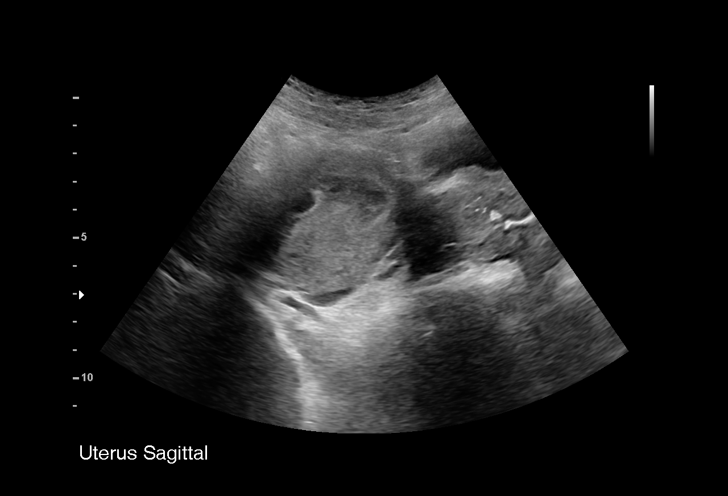
[im 8/51]
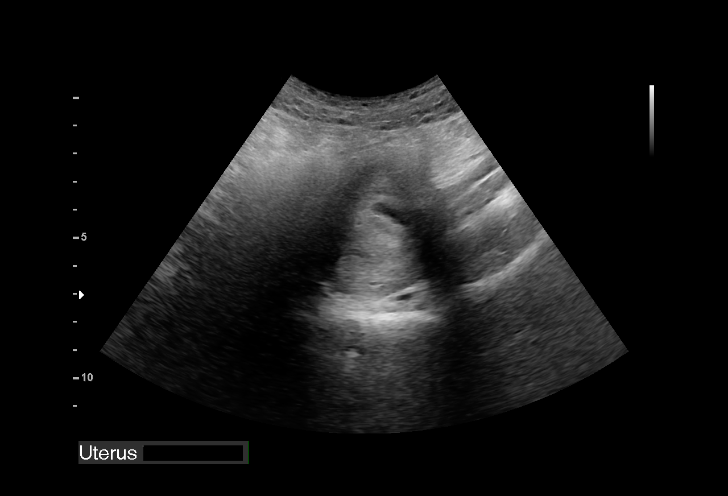
[im 12/51]
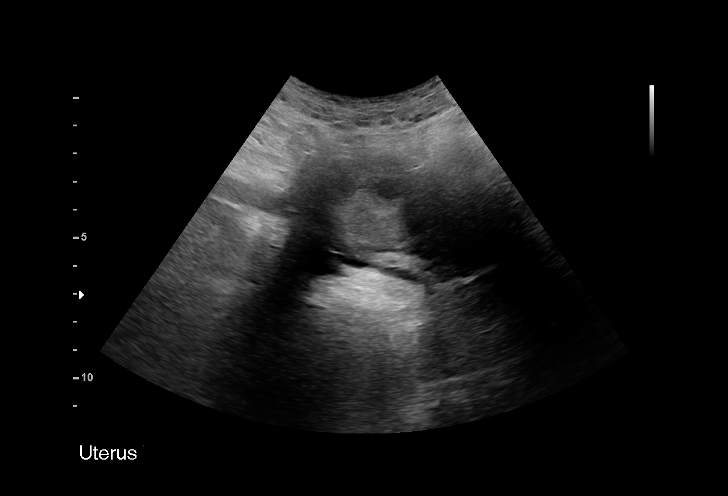
[im 15/51]
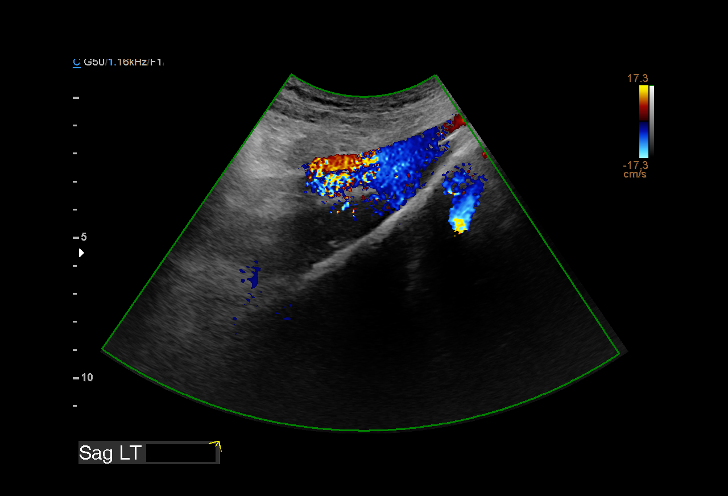
[im 19/51]
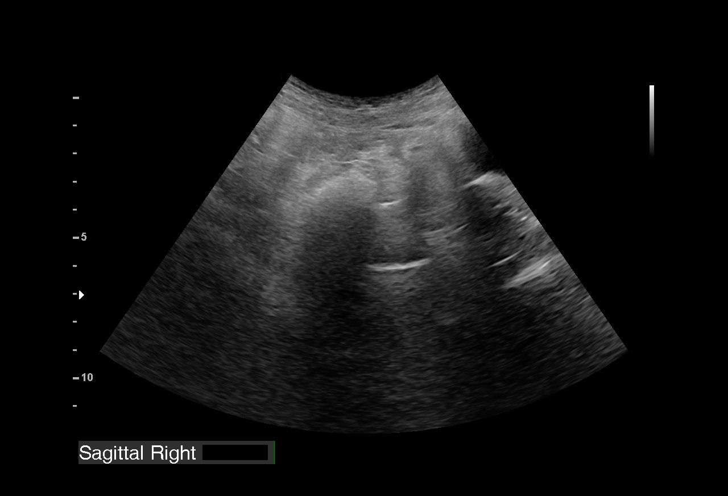
[im 23/51]
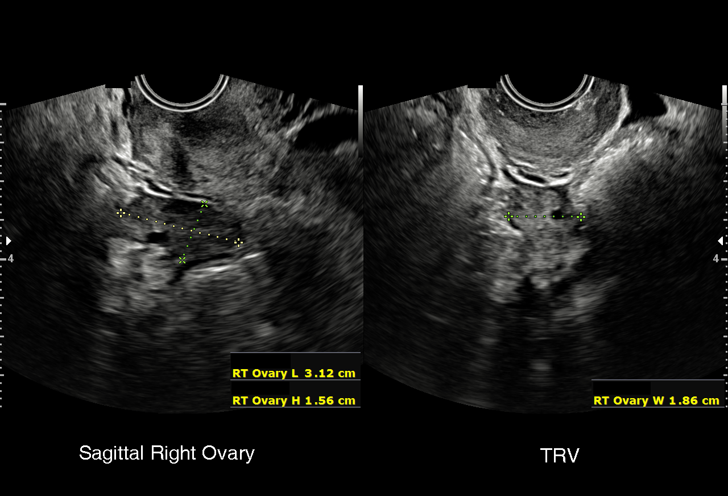
[im 26/51]
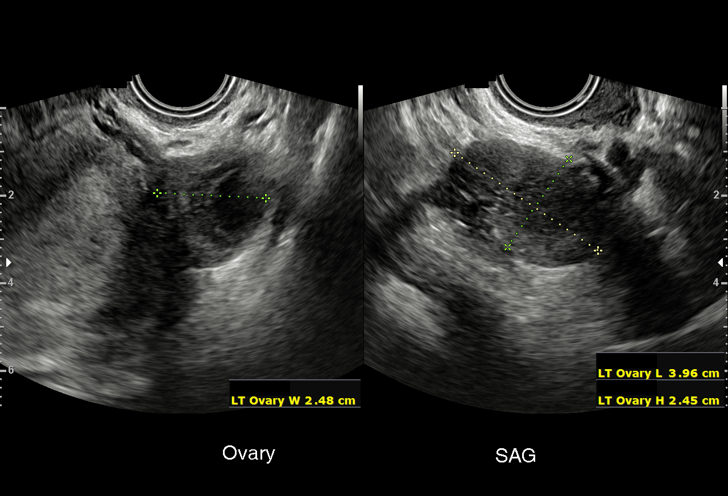
[im 28/51]
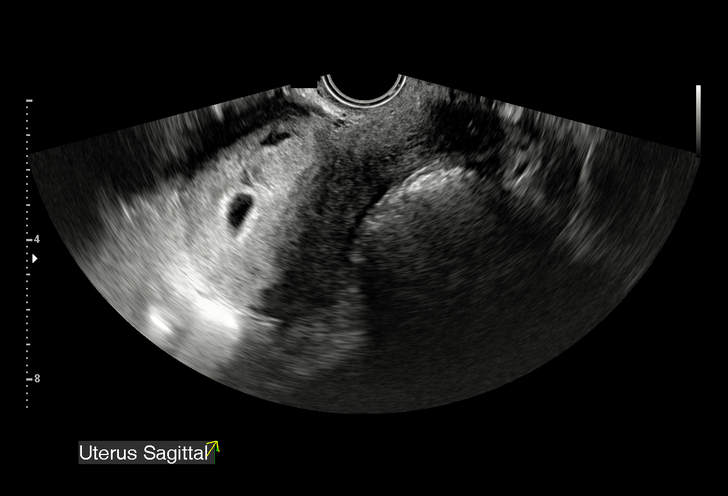
[im 32/51]
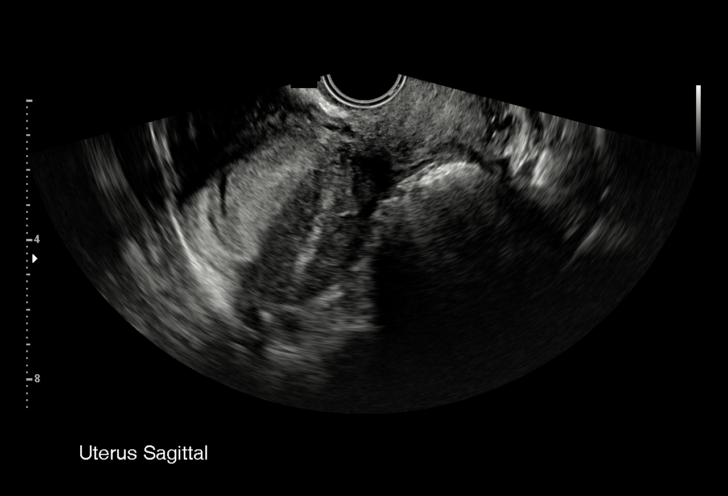
[im 36/51]
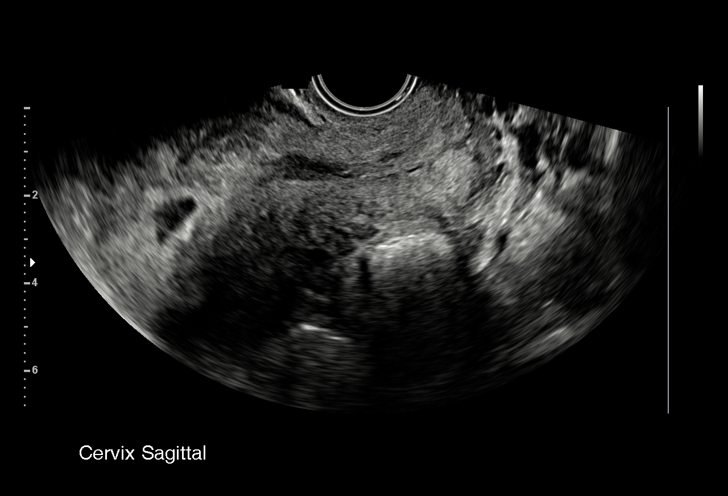
[im 39/51]
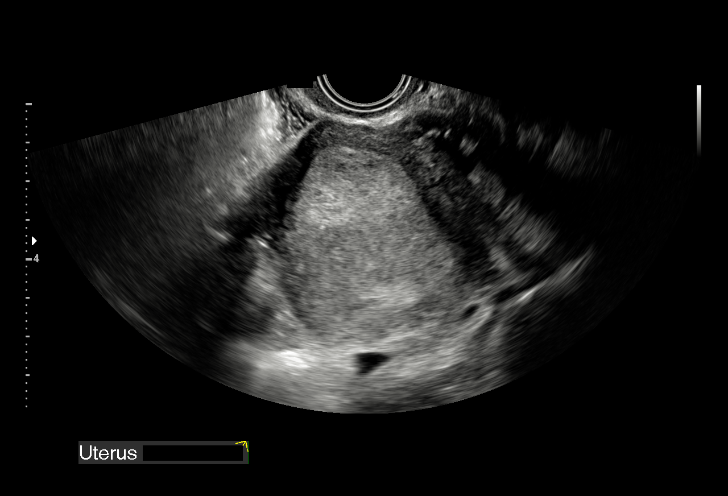
[im 43/51]
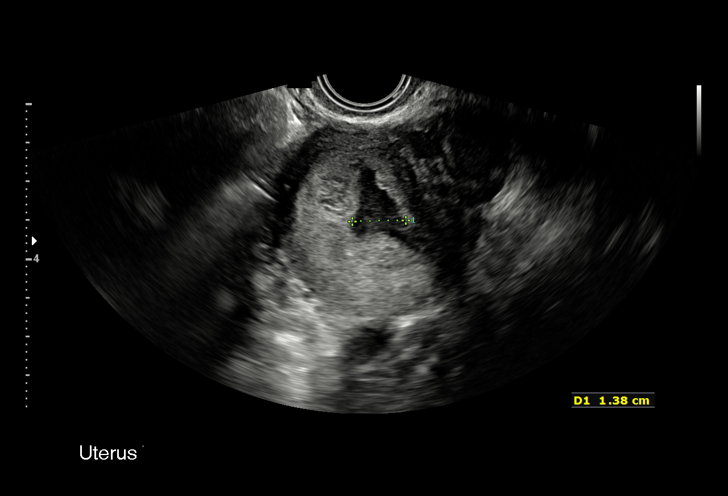
[im 47/51]
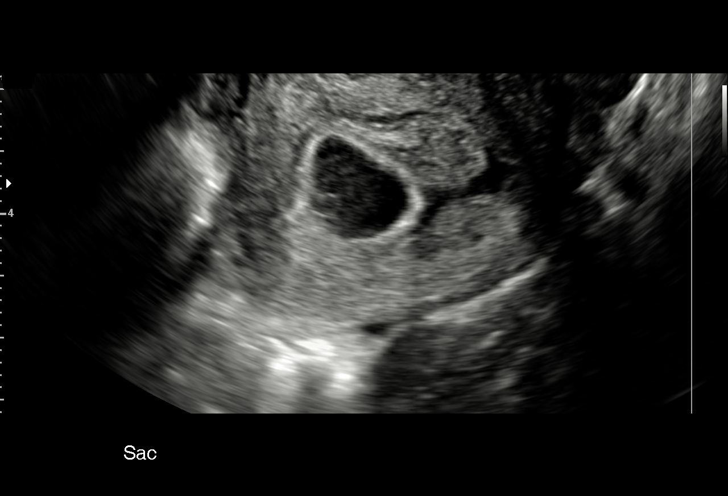
[im 51/51]
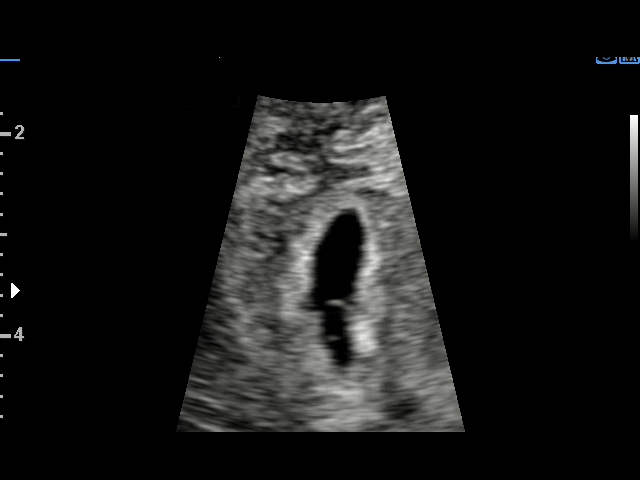

[15 of 28 positions shown; findings below may reference images not displayed]

FINDINGS: Intrauterine gestational sac: Single

Yolk sac:  Visualized

Embryo:  Not visualized

Cardiac Activity: Not visualized

Heart Rate:   bpm

MSD: 12.1 mm   6 w   0 d

CRL:    mm    w    d                  US EDC:

Subchorionic hemorrhage:  Moderate subchorionic hemorrhage.

Maternal uterus/adnexae: No adnexal mass or free fluid.
IMPRESSION: Six week intrauterine gestational sac with yolk sac present, but no
fetal pole. Follow-up ultrasound in 14 days recommended to ensure
expected progression.

Moderate subchorionic hemorrhage.

## 2021-05-18 DIAGNOSIS — Z419 Encounter for procedure for purposes other than remedying health state, unspecified: Secondary | ICD-10-CM | POA: Diagnosis not present

## 2021-05-23 IMAGING — US US OB TRANSVAGINAL
1 series · 16 of 28 positions shown · non-contrast
Comparison: 03/17/2019

CLINICAL DATA: Bleeding early pregnancy, viability

EXAM:
TRANSVAGINAL OB ULTRASOUND
TECHNIQUE: Transvaginal ultrasound was performed for complete evaluation of the
gestation as well as the maternal uterus, adnexal regions, and
pelvic cul-de-sac.

[Series 1: us ob transvaginal · 46 acquisitions, 16 frames shown]
[im 1/46]
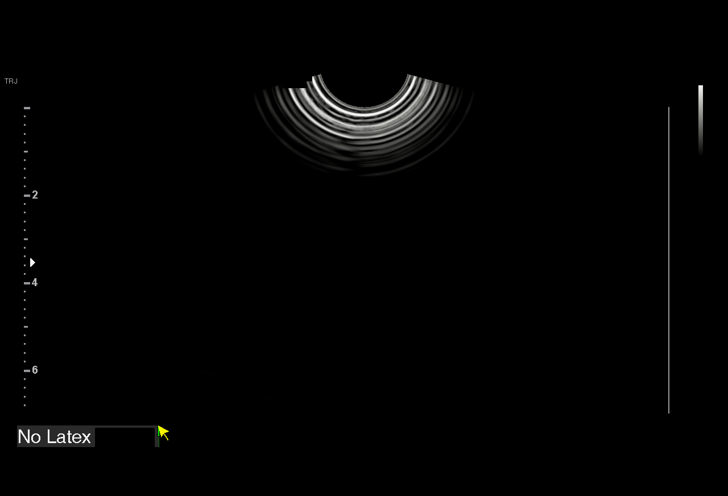
[im 4/46]
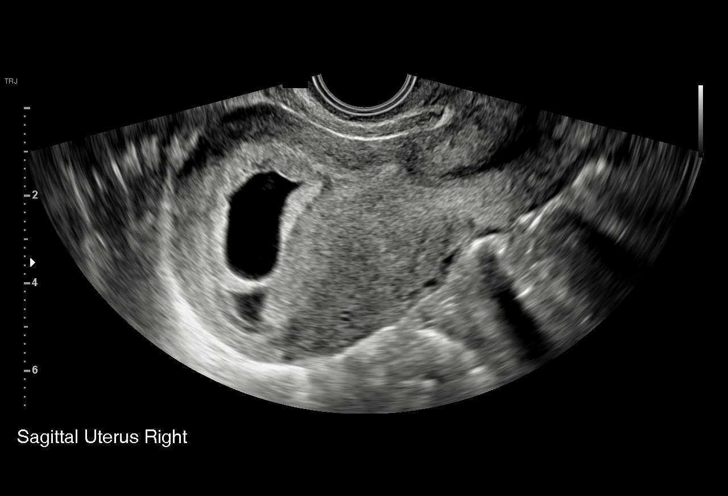
[im 7/46]
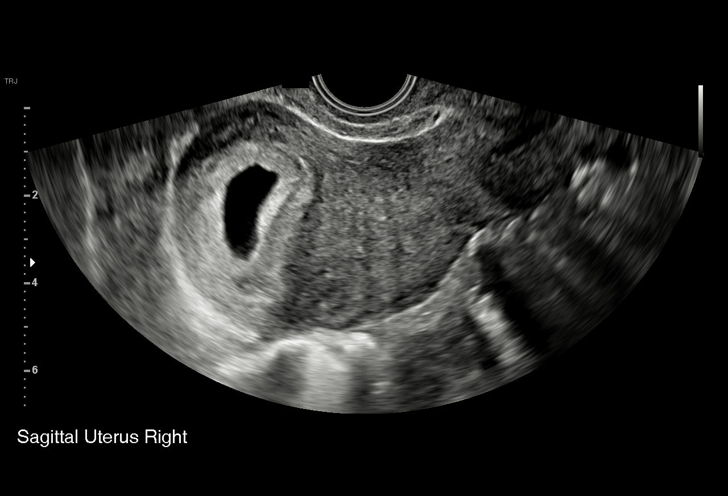
[im 11/46]
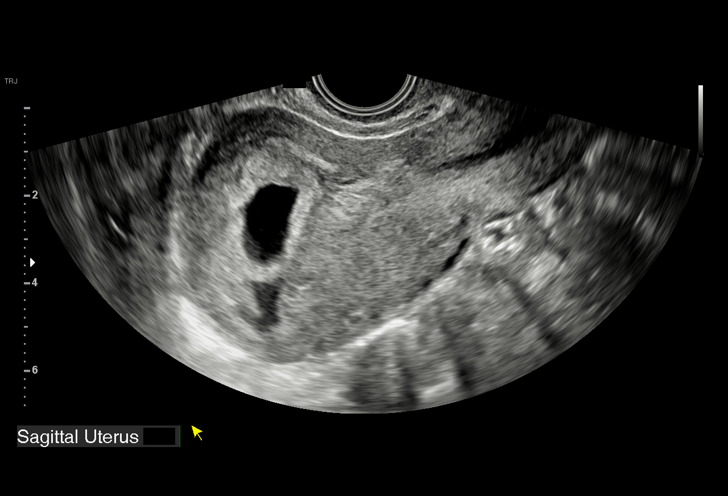
[im 12/46]
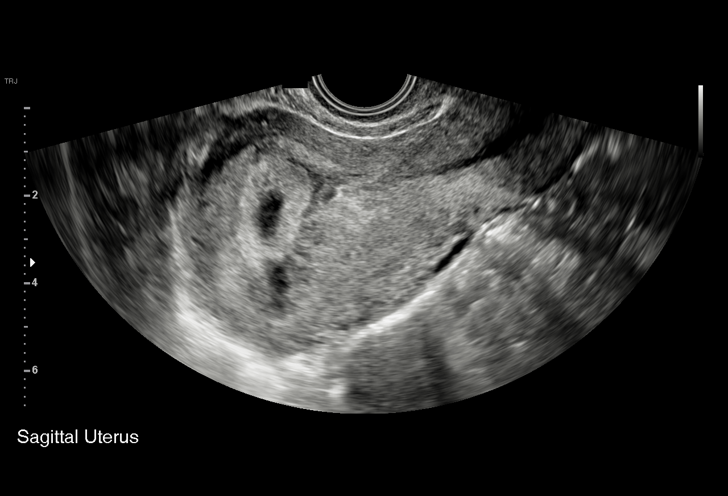
[im 16/46]
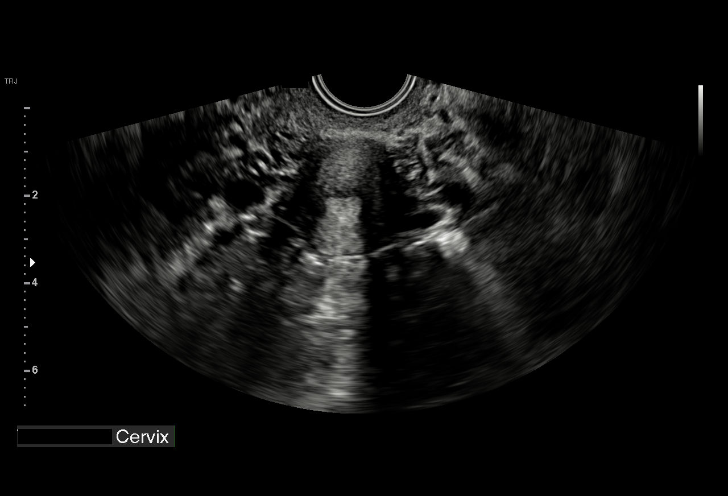
[im 19/46]
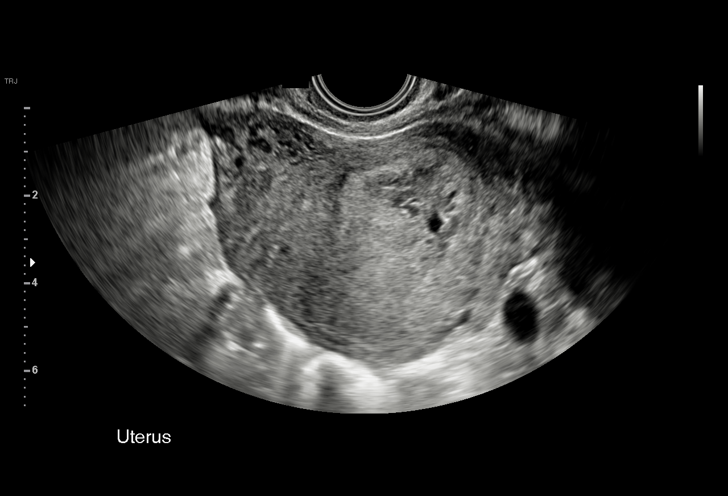
[im 22/46]
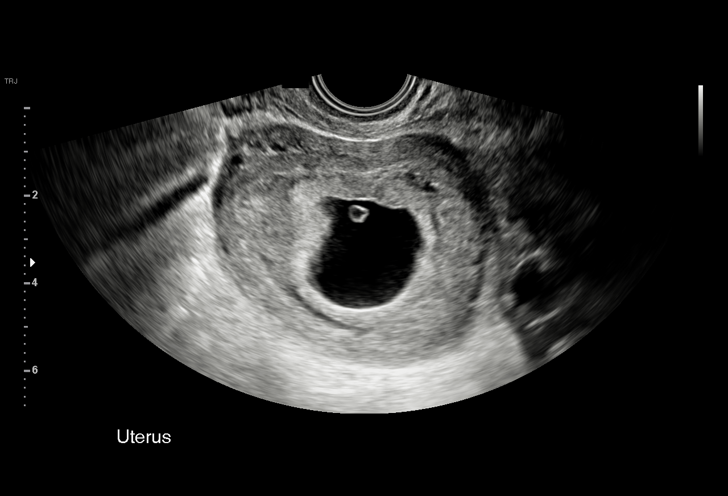
[im 24/46]
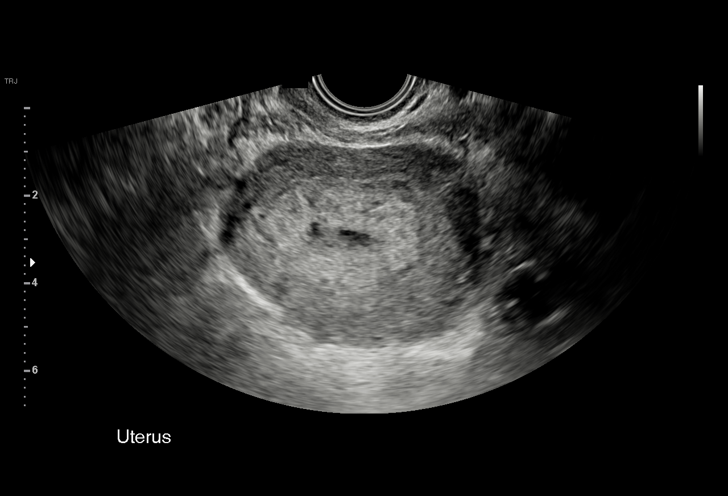
[im 27/46]
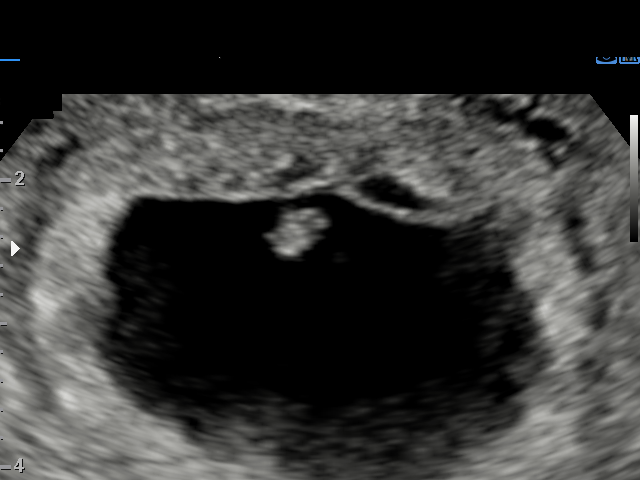
[im 31/46]
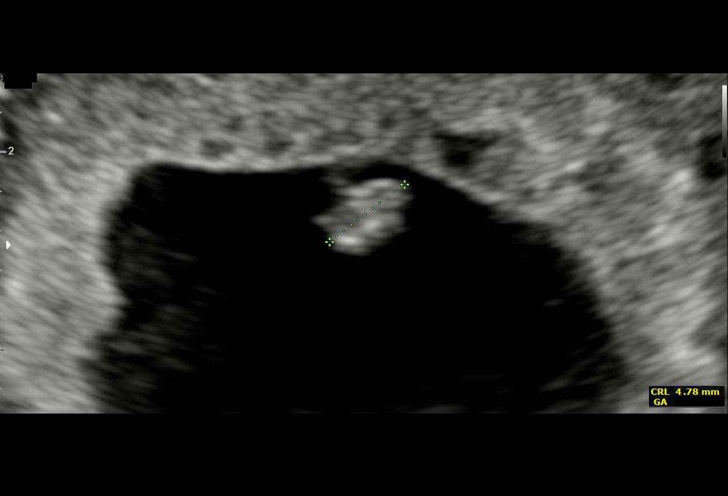
[im 34/46]
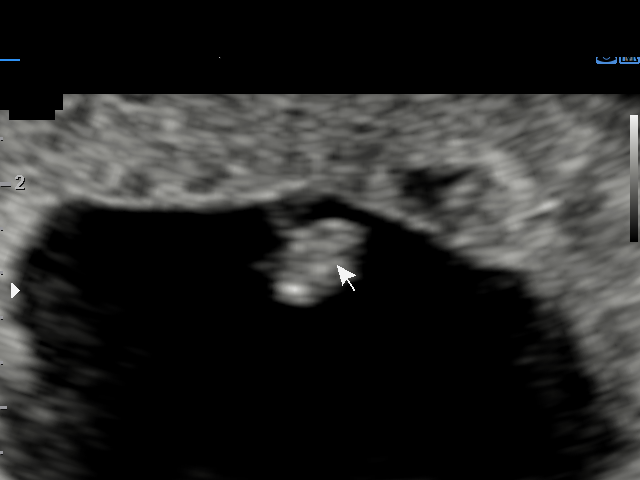
[im 36/46]
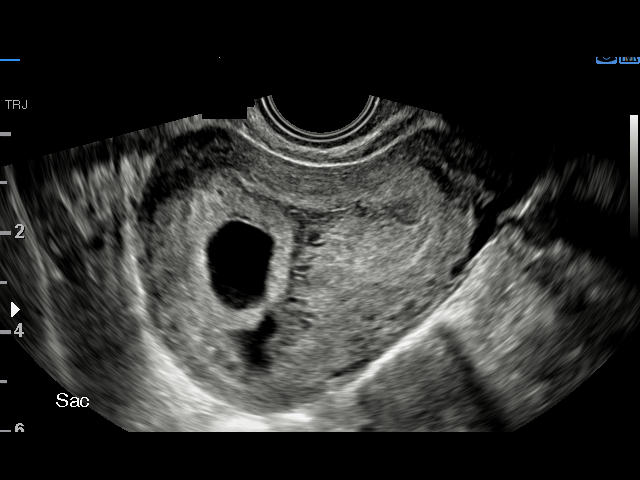
[im 39/46]
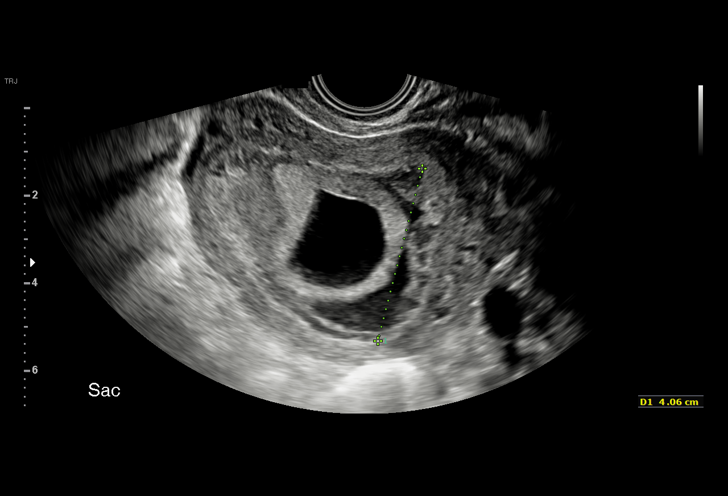
[im 42/46]
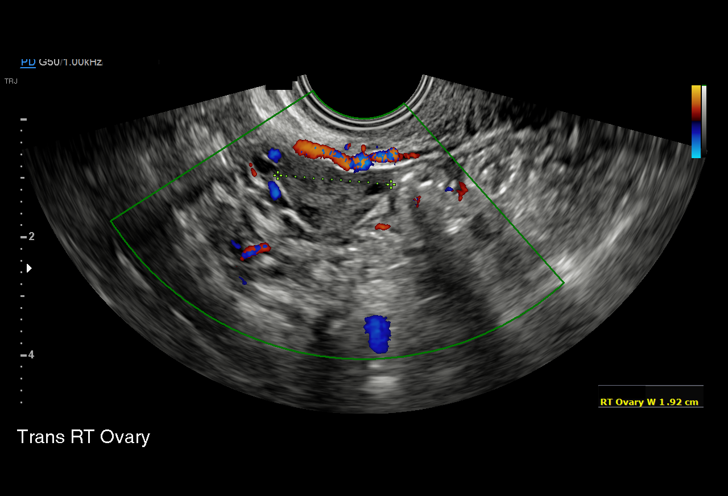
[im 46/46]
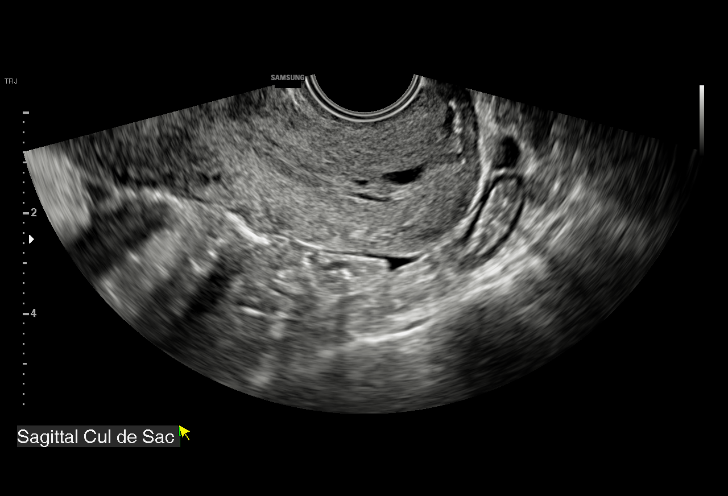

[16 of 28 positions shown; findings below may reference images not displayed]

FINDINGS: Intrauterine gestational sac: Single

Yolk sac:  Visualized.

Embryo:  Visualized.

Cardiac Activity: Visualized.

Heart Rate: 120 bpm

CRL: 4.6 mm   6 w 1 d                  US EDC: 11/16/2019

Subchorionic hemorrhage:  Moderate measuring 4.1 cm

Maternal uterus/adnexae: Unremarkable apart from corpus luteum on
the left. There is trace free fluid.
IMPRESSION: Embryo with cardiac activity is now visualized reflecting viable
intrauterine pregnancy.

Moderate subchorionic hemorrhage.

## 2021-06-17 DIAGNOSIS — Z419 Encounter for procedure for purposes other than remedying health state, unspecified: Secondary | ICD-10-CM | POA: Diagnosis not present

## 2021-06-30 ENCOUNTER — Telehealth: Payer: Medicaid Other | Admitting: Physician Assistant

## 2021-06-30 ENCOUNTER — Telehealth: Payer: Medicaid Other | Admitting: Family

## 2021-06-30 DIAGNOSIS — N898 Other specified noninflammatory disorders of vagina: Secondary | ICD-10-CM

## 2021-06-30 DIAGNOSIS — R102 Pelvic and perineal pain: Secondary | ICD-10-CM

## 2021-06-30 NOTE — Progress Notes (Signed)
Based on what you shared with me, I feel your condition warrants further evaluation and I recommend that you be seen in a face to face visit. ? ?Given you are having only vaginal discharge, you need to be seen face to face for further testing.  ?  ?NOTE: There will be NO CHARGE for this eVisit ?  ?If you are having a true medical emergency please call 911.   ?  ? For an urgent face to face visit, [facility] has six urgent care centers for your convenience:  ?  ? [facility] ?Get Driving Directions ?[phone] ?[phone] ?[place], [place] [address] ?  ? [facility]) ?Get Driving Directions ?[phone] ?[address] ?[place], [place] [address] ? ?[facility]) ?Get Driving Directions ?[phone] ?[address] ?[place],  [place]  [address] ? ?[facility] ?Get Driving Directions ?[phone] ?[address], [address] ?[place], [place] [address] ?  ? Urgent Care at [facility] [place] ?Get Driving Directions  ?[phone] ?[address] ?[address] ?[place], [place] [address] ?  ? Urgent Care at [facility] ?Get Driving Directions ?[phone] ?[address] ?[place], [place] [address] ? ?Your MyChart E-visit questionnaire answers were reviewed by a board certified advanced clinical practitioner to complete your personal care plan based on your specific symptoms.  Thank you for using e-Visits. ?  ? ?

## 2021-06-30 NOTE — Progress Notes (Signed)
Based on what you shared with me, I feel your condition warrants further evaluation and I recommend that you be seen in a face to face visit. ? ?You have submitted 2 visits for the same complaint today with different answers. The most recent was no discharge but vaginal pain. Vaginal pain needs an assessment in person as you need examination and swabbing. Please be evaluated today. Do not delay care.  ?  ?NOTE: There will be NO CHARGE for this eVisit ?  ?If you are having a true medical emergency please call 911.   ?  ? For an urgent face to face visit, [facility] has six urgent care centers for your convenience:  ?  ? [facility] ?Get Driving Directions ?[phone] ?[phone] ?[place], [place] [address] ?  ? [facility]) ?Get Driving Directions ?[phone] ?[address] ?[place], [place] [address] ? ?[facility]) ?Get Driving Directions ?[phone] ?[address] ?[place],  [place]  [address] ? ?[facility] ?Get Driving Directions ?[phone] ?[address] ?[place], [place] [address] ?  ?[facility] [place] ?Get Driving Directions  ?[phone] ?[address] ?[address] ?[place], [place] [address] ?  ?[facility] ?Get Driving Directions ?[phone] ?[address] ?[place], [place] [address] ? ?Your MyChart E-visit questionnaire answers were reviewed by a board certified advanced clinical practitioner to complete your personal care plan based on your specific symptoms.  Thank you for using e-Visits. ?  ? ?

## 2021-07-08 ENCOUNTER — Telehealth: Payer: Medicaid Other | Admitting: Family Medicine

## 2021-07-08 DIAGNOSIS — N898 Other specified noninflammatory disorders of vagina: Secondary | ICD-10-CM

## 2021-07-08 NOTE — Progress Notes (Signed)
[place]   Has not followed up in person as recommended last week on EV. On going symptoms- I have recommended she follow up as encouraged last week.  Message detail sent on this EV

## 2021-07-18 DIAGNOSIS — Z419 Encounter for procedure for purposes other than remedying health state, unspecified: Secondary | ICD-10-CM | POA: Diagnosis not present

## 2021-07-29 DIAGNOSIS — Z3202 Encounter for pregnancy test, result negative: Secondary | ICD-10-CM | POA: Diagnosis not present

## 2021-07-29 DIAGNOSIS — Z113 Encounter for screening for infections with a predominantly sexual mode of transmission: Secondary | ICD-10-CM | POA: Diagnosis not present

## 2021-07-29 DIAGNOSIS — Z114 Encounter for screening for human immunodeficiency virus [HIV]: Secondary | ICD-10-CM | POA: Diagnosis not present

## 2021-07-29 DIAGNOSIS — N760 Acute vaginitis: Secondary | ICD-10-CM | POA: Diagnosis not present

## 2021-07-29 DIAGNOSIS — N898 Other specified noninflammatory disorders of vagina: Secondary | ICD-10-CM | POA: Diagnosis not present

## 2021-07-29 DIAGNOSIS — Z1159 Encounter for screening for other viral diseases: Secondary | ICD-10-CM | POA: Diagnosis not present

## 2021-07-29 DIAGNOSIS — B9689 Other specified bacterial agents as the cause of diseases classified elsewhere: Secondary | ICD-10-CM | POA: Diagnosis not present

## 2021-08-08 ENCOUNTER — Emergency Department (HOSPITAL_COMMUNITY)
Admission: EM | Admit: 2021-08-08 | Discharge: 2021-08-09 | Discharge status: Against Medical Advice | Payer: Medicaid Other | Attending: Emergency Medicine | Admitting: Emergency Medicine

## 2021-08-08 ENCOUNTER — Encounter (HOSPITAL_COMMUNITY): Payer: Self-pay | Admitting: Emergency Medicine

## 2021-08-08 DIAGNOSIS — B9689 Other specified bacterial agents as the cause of diseases classified elsewhere: Secondary | ICD-10-CM | POA: Diagnosis not present

## 2021-08-08 DIAGNOSIS — Z5321 Procedure and treatment not carried out due to patient leaving prior to being seen by health care provider: Secondary | ICD-10-CM | POA: Insufficient documentation

## 2021-08-08 DIAGNOSIS — R21 Rash and other nonspecific skin eruption: Secondary | ICD-10-CM | POA: Insufficient documentation

## 2021-08-08 DIAGNOSIS — N760 Acute vaginitis: Secondary | ICD-10-CM | POA: Insufficient documentation

## 2021-08-08 NOTE — ED Notes (Signed)
Pt called to be roomed, no response 

## 2021-08-08 NOTE — ED Notes (Signed)
Pt called again to be roomed, no response x2

## 2021-08-08 NOTE — ED Triage Notes (Signed)
Patient here with complain of vaginal pain and rash after starting a gel for bacterial vaginosis. Patient is alert, oriented, and in no apparent distress at this time.

## 2021-08-08 NOTE — ED Provider Triage Note (Signed)
Emergency Medicine Provider Triage Evaluation Note  Toni Mitchell , a 31 y.o. female  was evaluated in triage.  Pt complains of vaginal rash.  States that she has had some blistering in the vaginal area after using MetroGel for BV.  States that vaginal discharge has improved.  No lip swelling, difficulty breathing or swallowing, other skin reaction.    Review of Systems  Positive: Vaginal rash Negative: Vaginal discharge  Physical Exam  BP 137/81   Pulse 85   Temp 98.6 F (37 C) (Oral)   Resp 16   SpO2 100%  Gen:   Awake, no distress   Resp:  Normal effort  MSK:   Moves extremities without difficulty  Other:    Medical Decision Making  Medically screening exam initiated at 6:56 PM.  Appropriate orders placed.  Toni Mitchell was informed that the remainder of the evaluation will be completed by another provider, this initial triage assessment does not replace that evaluation, and the importance of remaining in the ED until their evaluation is complete.     Toni Crigler, PA-C 08/08/21 1857

## 2021-08-17 DIAGNOSIS — Z419 Encounter for procedure for purposes other than remedying health state, unspecified: Secondary | ICD-10-CM | POA: Diagnosis not present

## 2021-08-18 DIAGNOSIS — Z202 Contact with and (suspected) exposure to infections with a predominantly sexual mode of transmission: Secondary | ICD-10-CM | POA: Diagnosis not present

## 2021-09-17 DIAGNOSIS — Z419 Encounter for procedure for purposes other than remedying health state, unspecified: Secondary | ICD-10-CM | POA: Diagnosis not present

## 2021-10-18 DIAGNOSIS — Z419 Encounter for procedure for purposes other than remedying health state, unspecified: Secondary | ICD-10-CM | POA: Diagnosis not present

## 2021-11-17 DIAGNOSIS — Z419 Encounter for procedure for purposes other than remedying health state, unspecified: Secondary | ICD-10-CM | POA: Diagnosis not present

## 2021-11-18 IMAGING — CT CT RENAL STONE PROTOCOL
2 of 4 series · 16 of 46 positions shown, 18 images · non-contrast
Comparison: Obstetrical ultrasound, most recently 03/24/2019

CLINICAL DATA: Left flank pain, stone disease suspected

EXAM:
CT ABDOMEN AND PELVIS WITHOUT CONTRAST
TECHNIQUE: Multidetector CT imaging of the abdomen and pelvis was performed
following the standard protocol without IV contrast.

[Series 3: a/p w/o 5mm · axial · non-contrast · 0.76mm/px · z∈[+890,+1260]mm · 13 of 82 slices shown, 15 images]
[im 4/82  soft-tissue]
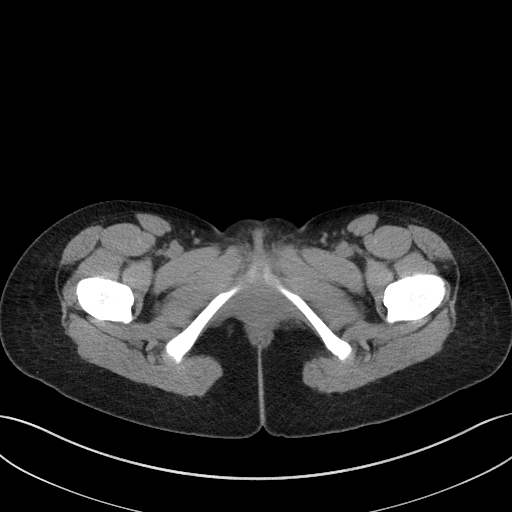
[im 4/82  bone]
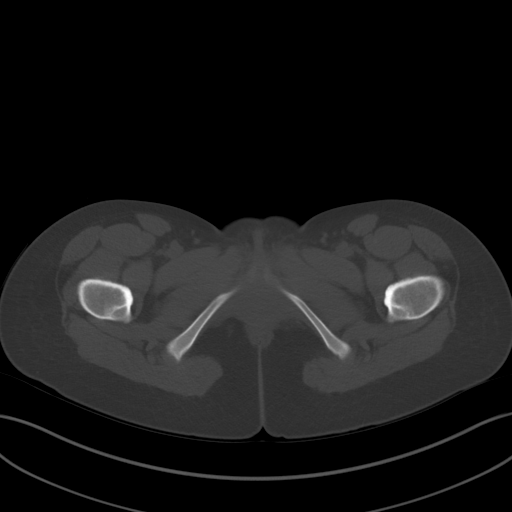
[im 11/82  soft-tissue]
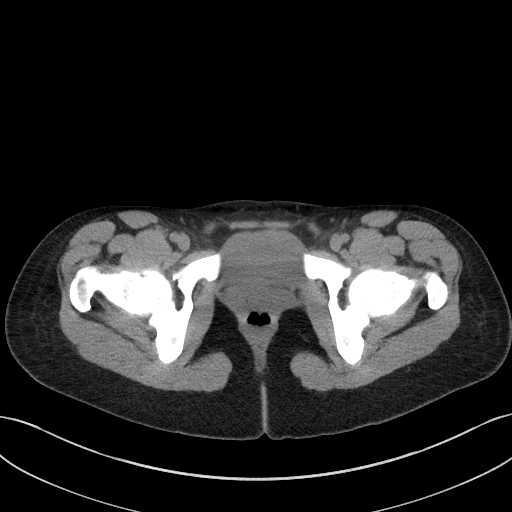
[im 17/82  soft-tissue]
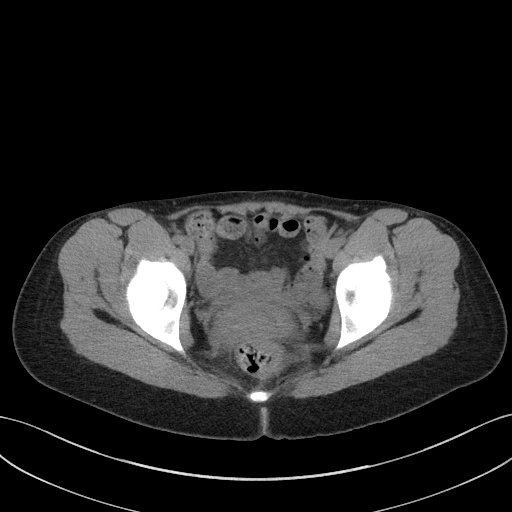
[im 24/82  soft-tissue]
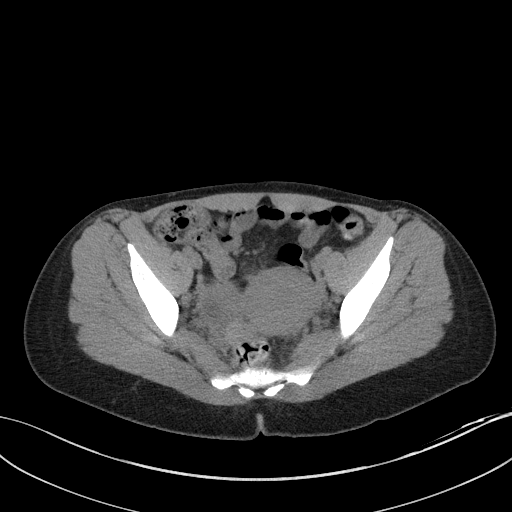
[im 28/82  soft-tissue]
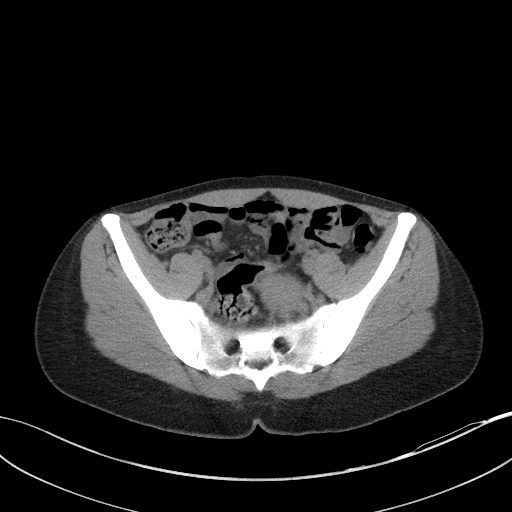
[im 34/82  soft-tissue]
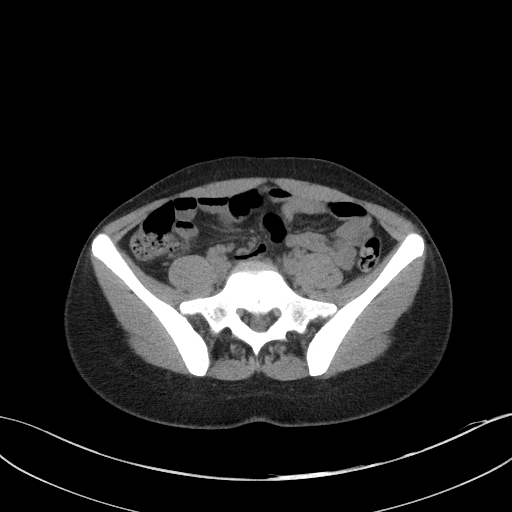
[im 41/82  soft-tissue]
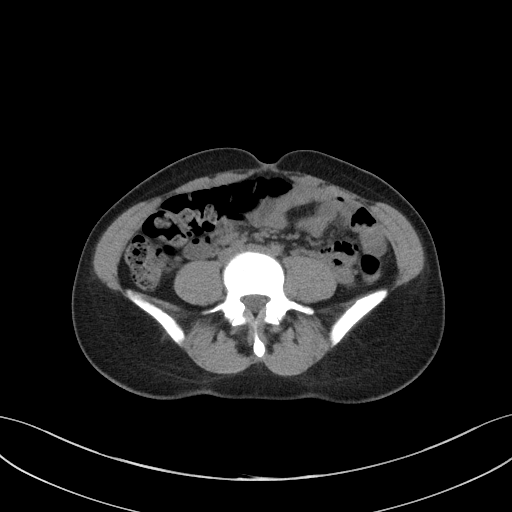
[im 48/82  soft-tissue]
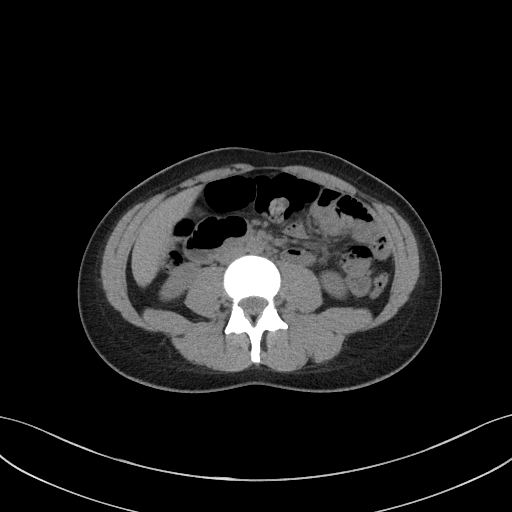
[im 55/82  soft-tissue]
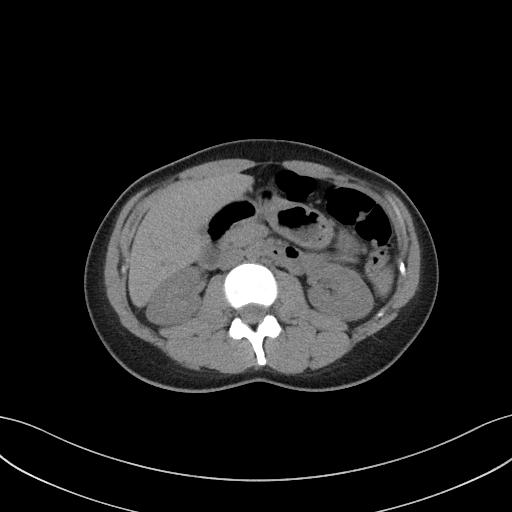
[im 55/82  bone]
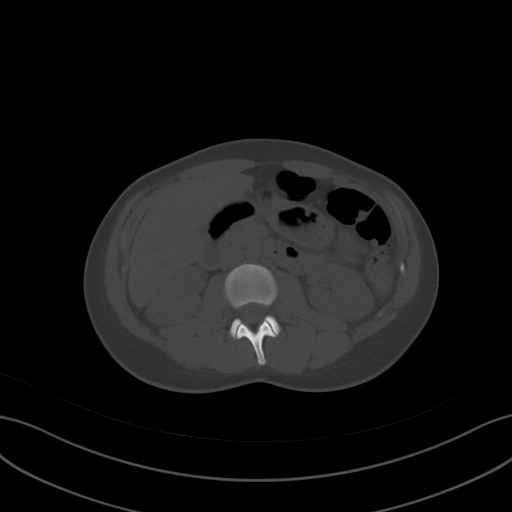
[im 58/82  soft-tissue]
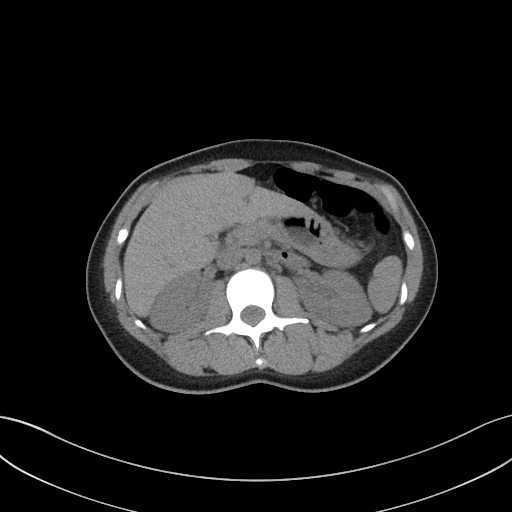
[im 65/82  soft-tissue]
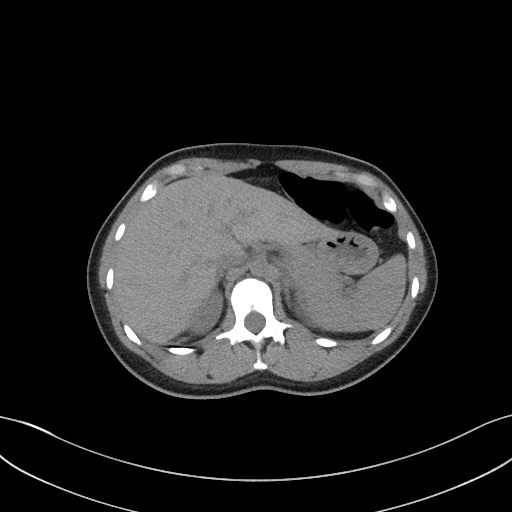
[im 71/82  soft-tissue]
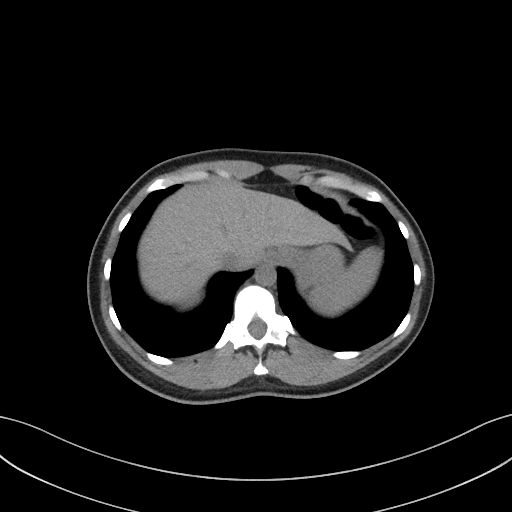
[im 78/82  soft-tissue]
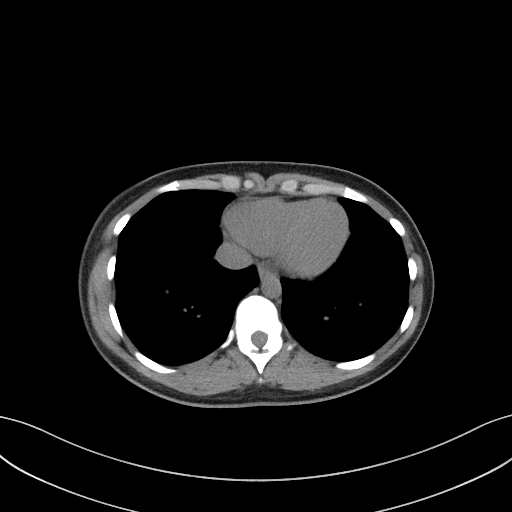

[Series 6: a/p w/o cor · coronal · non-contrast · 0.68mm/px · 3 of 121 slices shown]
[im 41/121  soft-tissue]
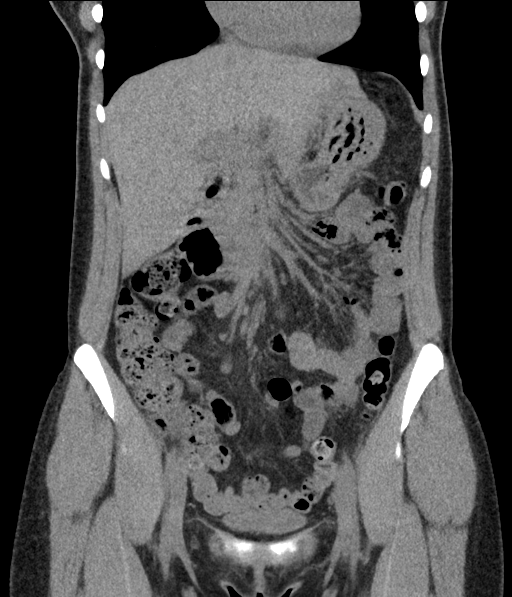
[im 54/121  soft-tissue]
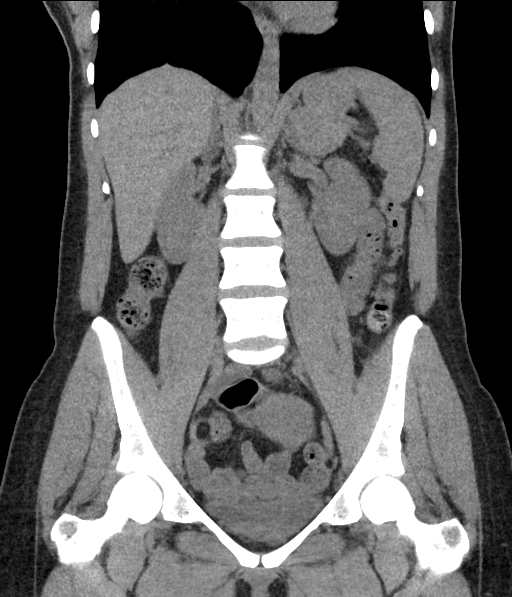
[im 67/121  soft-tissue]
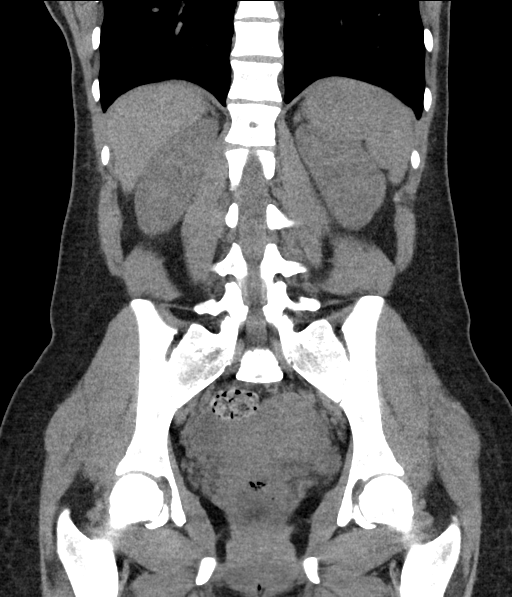

[16 of 46 positions shown; findings below may reference images not displayed]

FINDINGS: Lower chest: Lung bases are clear. Normal heart size. No pericardial
effusion.

Hepatobiliary: No visible focal liver lesion on this unenhanced CT.
Normal liver attenuation. Smooth liver surface contour. Gallbladder
partially decompressed but otherwise unremarkable. No biliary ductal
dilatation. No visible calcified gallstones.

Pancreas: Unremarkable. No pancreatic ductal dilatation or
surrounding inflammatory changes.

Spleen: Normal in size. No concerning splenic lesions.

Adrenals/Urinary Tract: Normal adrenal glands. No visible or contour
deforming worrisome renal lesions. No urolithiasis or hydronephrosis
is evident. No visible calcifications along the course of either
ureter. Urinary bladder is largely decompressed at the time of exam
and therefore poorly evaluated by CT imaging. No gross bladder
abnormality, visible bladder or urethral calculi are identified

Stomach/Bowel: Distal esophagus, stomach and duodenal sweep are
unremarkable. No small bowel wall thickening or dilatation.
Fecalization of the distal small bowel contents without evidence of
mechanical obstruction. No colonic dilatation or wall thickening. A
normal appendix is visualized.

Vascular/Lymphatic: No significant vascular findings are present. No
enlarged abdominal or pelvic lymph nodes.

Reproductive: Anteverted uterus. Normal follicles present in both
ovaries including a dominant follicle in the right ovary. No
concerning adnexal lesions.

Other: No abdominopelvic free fluid or free gas. No bowel containing
hernias.

Musculoskeletal: No acute osseous abnormality or suspicious osseous
lesion. Mild arthrosis of the posterosuperior SI joints.
Nonspecific.
IMPRESSION: 1. No acute urinary tract abnormality is evident. Specifically no
evidence of urolithiasis or hydronephrosis.
2. Fecalization of the distal small bowel contents without evidence
of mechanical obstruction. Findings are nonspecific but can be seen
with slowed intestinal transit/constipation.
3. Mild arthrosis of the posterosuperior SI joints.

## 2021-12-11 DIAGNOSIS — N898 Other specified noninflammatory disorders of vagina: Secondary | ICD-10-CM | POA: Diagnosis not present

## 2021-12-18 DIAGNOSIS — Z419 Encounter for procedure for purposes other than remedying health state, unspecified: Secondary | ICD-10-CM | POA: Diagnosis not present

## 2022-01-17 DIAGNOSIS — Z419 Encounter for procedure for purposes other than remedying health state, unspecified: Secondary | ICD-10-CM | POA: Diagnosis not present

## 2022-02-17 DIAGNOSIS — Z419 Encounter for procedure for purposes other than remedying health state, unspecified: Secondary | ICD-10-CM | POA: Diagnosis not present

## 2022-03-20 DIAGNOSIS — Z419 Encounter for procedure for purposes other than remedying health state, unspecified: Secondary | ICD-10-CM | POA: Diagnosis not present

## 2022-04-18 DIAGNOSIS — Z419 Encounter for procedure for purposes other than remedying health state, unspecified: Secondary | ICD-10-CM | POA: Diagnosis not present

## 2022-04-30 ENCOUNTER — Encounter (HOSPITAL_COMMUNITY): Payer: Self-pay | Admitting: *Deleted

## 2022-04-30 ENCOUNTER — Emergency Department (HOSPITAL_COMMUNITY)
Admission: EM | Admit: 2022-04-30 | Discharge: 2022-05-01 | Discharge status: Against Medical Advice | Payer: Medicaid Other | Attending: Emergency Medicine | Admitting: Emergency Medicine

## 2022-04-30 DIAGNOSIS — M549 Dorsalgia, unspecified: Secondary | ICD-10-CM | POA: Diagnosis present

## 2022-04-30 DIAGNOSIS — M545 Low back pain, unspecified: Secondary | ICD-10-CM | POA: Diagnosis not present

## 2022-04-30 DIAGNOSIS — Z5321 Procedure and treatment not carried out due to patient leaving prior to being seen by health care provider: Secondary | ICD-10-CM | POA: Insufficient documentation

## 2022-04-30 DIAGNOSIS — R509 Fever, unspecified: Secondary | ICD-10-CM | POA: Insufficient documentation

## 2022-04-30 NOTE — ED Provider Triage Note (Signed)
Emergency Medicine Provider Triage Evaluation Note  Toni Mitchell , a 32 y.o. female  was evaluated in triage.  Pt complains of back pain which began 2 days ago.  Cramping sensation to the lumbar spine.  Had a subjective fever of 100.  She denies any vaginal discharge, urinary symptoms.  No prior history of IV drug use, no prior history of cancer.  I  Review of Systems  Positive: Fever, Back pain Negative: Vaginal discharge, bleeding  Physical Exam  BP 112/88 (BP Location: Right Arm)   Pulse 87   Temp 99.1 F (37.3 C) (Oral)   Resp 18   Ht '5\' 7"'$  (1.702 m)   Wt 54.4 kg   LMP 04/04/2022 (Exact Date)   SpO2 99%   BMI 18.79 kg/m  Gen:   Awake, no distress   Resp:  Normal effort  MSK:   Moves extremities without difficulty  Other:  Ambulatory with steady gate  Medical Decision Making  Medically screening exam initiated at 5:36 PM.  Appropriate orders placed.  [name] was informed that the remainder of the evaluation will be completed by another provider, this initial triage assessment does not replace that evaluation, and the importance of remaining in the ED until their evaluation is complete.     Janeece Fitting, PA-C 04/30/22 1738

## 2022-04-30 NOTE — ED Triage Notes (Signed)
Here by POV from home for low back pain, nausea, some dizziness "kind of", and subjective fever. Denies VD, bleeding, urinary or vaginal sx. Last BM yesterday. Last ate this am. No meds PTA. Back pain worse when sitting. Better when lying down.

## 2022-05-19 DIAGNOSIS — Z419 Encounter for procedure for purposes other than remedying health state, unspecified: Secondary | ICD-10-CM | POA: Diagnosis not present

## 2022-06-18 DIAGNOSIS — Z419 Encounter for procedure for purposes other than remedying health state, unspecified: Secondary | ICD-10-CM | POA: Diagnosis not present

## 2022-07-19 DIAGNOSIS — Z419 Encounter for procedure for purposes other than remedying health state, unspecified: Secondary | ICD-10-CM | POA: Diagnosis not present

## 2022-08-18 DIAGNOSIS — Z419 Encounter for procedure for purposes other than remedying health state, unspecified: Secondary | ICD-10-CM | POA: Diagnosis not present

## 2022-09-08 ENCOUNTER — Inpatient Hospital Stay (HOSPITAL_COMMUNITY): Payer: Medicaid Other

## 2022-09-08 ENCOUNTER — Encounter (HOSPITAL_COMMUNITY): Payer: Self-pay | Admitting: *Deleted

## 2022-09-08 ENCOUNTER — Inpatient Hospital Stay (HOSPITAL_COMMUNITY)
Admission: AD | Admit: 2022-09-08 | Discharge: 2022-09-08 | Disposition: A | Discharge status: Home / Self Care | Payer: Medicaid Other | Attending: Family Medicine | Admitting: Family Medicine

## 2022-09-08 DIAGNOSIS — O209 Hemorrhage in early pregnancy, unspecified: Secondary | ICD-10-CM | POA: Diagnosis not present

## 2022-09-08 DIAGNOSIS — Z3A01 Less than 8 weeks gestation of pregnancy: Secondary | ICD-10-CM | POA: Diagnosis not present

## 2022-09-08 DIAGNOSIS — O208 Other hemorrhage in early pregnancy: Secondary | ICD-10-CM | POA: Diagnosis not present

## 2022-09-08 LAB — CBC
HCT: 30.3 % — ABNORMAL LOW (ref 36.0–46.0)
Hemoglobin: 9.9 g/dL — ABNORMAL LOW (ref 12.0–15.0)
MCH: 22.8 pg — ABNORMAL LOW (ref 26.0–34.0)
MCHC: 32.7 g/dL (ref 30.0–36.0)
MCV: 69.8 fL — ABNORMAL LOW (ref 80.0–100.0)
Platelets: 258 10*3/uL (ref 150–400)
RBC: 4.34 MIL/uL (ref 3.87–5.11)
RDW: 16.4 % — ABNORMAL HIGH (ref 11.5–15.5)
WBC: 6.3 10*3/uL (ref 4.0–10.5)
nRBC: 0 % (ref 0.0–0.2)

## 2022-09-08 LAB — COMPREHENSIVE METABOLIC PANEL WITH GFR
ALT: 15 U/L (ref 0–44)
AST: 17 U/L (ref 15–41)
Albumin: 4 g/dL (ref 3.5–5.0)
Alkaline Phosphatase: 39 U/L (ref 38–126)
Anion gap: 9 (ref 5–15)
BUN: 7 mg/dL (ref 6–20)
CO2: 21 mmol/L — ABNORMAL LOW (ref 22–32)
Calcium: 8.8 mg/dL — ABNORMAL LOW (ref 8.9–10.3)
Chloride: 105 mmol/L (ref 98–111)
Creatinine, Ser: 0.75 mg/dL (ref 0.44–1.00)
GFR, Estimated: >60 mL/min
Glucose, Bld: 88 mg/dL (ref 70–99)
Potassium: 3.6 mmol/L (ref 3.5–5.1)
Sodium: 135 mmol/L (ref 135–145)
Total Bilirubin: 0.4 mg/dL (ref 0.3–1.2)
Total Protein: 7.1 g/dL (ref 6.5–8.1)

## 2022-09-08 LAB — URINALYSIS, ROUTINE W REFLEX MICROSCOPIC
Bilirubin Urine: NEGATIVE
Glucose, UA: NEGATIVE mg/dL
Ketones, ur: NEGATIVE mg/dL
Leukocytes,Ua: NEGATIVE
Nitrite: NEGATIVE
Protein, ur: NEGATIVE mg/dL
Specific Gravity, Urine: 1.025 (ref 1.005–1.030)
pH: 5 (ref 5.0–8.0)

## 2022-09-08 LAB — WET PREP, GENITAL
Clue Cells Wet Prep HPF POC: NONE SEEN
Sperm: NONE SEEN
Trich, Wet Prep: NONE SEEN
WBC, Wet Prep HPF POC: <10 (ref <10)
Yeast Wet Prep HPF POC: NONE SEEN

## 2022-09-08 LAB — HCG, QUANTITATIVE, PREGNANCY: hCG, Beta Chain, Quant, S: 12159 m[IU]/mL — ABNORMAL HIGH

## 2022-09-08 LAB — POCT PREGNANCY, URINE: Preg Test, Ur: POSITIVE — AB

## 2022-09-08 NOTE — MAU Provider Note (Signed)
MAU note     S Ms. Toni Mitchell is a 32 y.o. G1P0 patient who presents to MAU today with complaint of bleeding.  She is having some cramping.  Bleeding started today with light pink spotting and pelvic cramping.  No bleeding or provoking factors.  O BP 124/76   Pulse 89   Temp 99.3 F (37.4 C) (Oral)   Resp 16   Ht 5\' 6"  (1.676 m)   Wt 54 kg   LMP 08/07/2022   SpO2 99%   BMI 19.22 kg/m  Physical Exam Vitals and nursing note reviewed.  Constitutional:      Appearance: She is well-developed.  HENT:     Head: Normocephalic and atraumatic.  Abdominal:     Palpations: Abdomen is soft.     Tenderness: There is no abdominal tenderness.  Skin:    General: Skin is warm and dry.  Neurological:     Mental Status: She is alert.    Results for orders placed or performed during the hospital encounter of 09/08/22 (from the past 24 hour(s))  Urinalysis, Routine w reflex microscopic -Urine, Clean Catch     Status: Abnormal   Collection Time: 09/08/22  3:20 PM  Result Value Ref Range   Color, Urine YELLOW YELLOW   APPearance HAZY (A) CLEAR   Specific Gravity, Urine 1.025 1.005 - 1.030   pH 5.0 5.0 - 8.0   Glucose, UA NEGATIVE NEGATIVE mg/dL   Hgb urine dipstick SMALL (A) NEGATIVE   Bilirubin Urine NEGATIVE NEGATIVE   Ketones, ur NEGATIVE NEGATIVE mg/dL   Protein, ur NEGATIVE NEGATIVE mg/dL   Nitrite NEGATIVE NEGATIVE   Leukocytes,Ua NEGATIVE NEGATIVE   RBC / HPF 0-5 0 - 5 RBC/hpf   WBC, UA 6-10 0 - 5 WBC/hpf   Bacteria, UA RARE (A) NONE SEEN   Squamous Epithelial / HPF 6-10 0 - 5 /HPF   Mucus PRESENT   Pregnancy, urine POC     Status: Abnormal   Collection Time: 09/08/22  3:21 PM  Result Value Ref Range   Preg Test, Ur POSITIVE (A) NEGATIVE  CBC     Status: Abnormal   Collection Time: 09/08/22  4:37 PM  Result Value Ref Range   WBC 6.3 4.0 - 10.5 K/uL   RBC 4.34 3.87 - 5.11 MIL/uL   Hemoglobin 9.9 (L) 12.0 - 15.0 g/dL   HCT 98.1 (L) 19.1 - 47.8 %   MCV 69.8 (L)  80.0 - 100.0 fL   MCH 22.8 (L) 26.0 - 34.0 pg   MCHC 32.7 30.0 - 36.0 g/dL   RDW 29.5 (H) 62.1 - 30.8 %   Platelets 258 150 - 400 K/uL   nRBC 0.0 0.0 - 0.2 %  hCG, quantitative, pregnancy     Status: Abnormal   Collection Time: 09/08/22  4:37 PM  Result Value Ref Range   hCG, Beta Chain, Quant, S 12,159 (H) <5 mIU/mL  Comprehensive metabolic panel     Status: Abnormal   Collection Time: 09/08/22  4:37 PM  Result Value Ref Range   Sodium 135 135 - 145 mmol/L   Potassium 3.6 3.5 - 5.1 mmol/L   Chloride 105 98 - 111 mmol/L   CO2 21 (L) 22 - 32 mmol/L   Glucose, Bld 88 70 - 99 mg/dL   BUN 7 6 - 20 mg/dL   Creatinine, Ser 6.57 0.44 - 1.00 mg/dL   Calcium 8.8 (L) 8.9 - 10.3 mg/dL   Total Protein 7.1 6.5 - 8.1 g/dL  Albumin 4.0 3.5 - 5.0 g/dL   AST 17 15 - 41 U/L   ALT 15 0 - 44 U/L   Alkaline Phosphatase 39 38 - 126 U/L   Total Bilirubin 0.4 0.3 - 1.2 mg/dL   GFR, Estimated >21 >30 mL/min   Anion gap 9 5 - 15  Wet prep, genital     Status: None   Collection Time: 09/08/22  4:45 PM  Result Value Ref Range   Yeast Wet Prep HPF POC NONE SEEN NONE SEEN   Trich, Wet Prep NONE SEEN NONE SEEN   Clue Cells Wet Prep HPF POC NONE SEEN NONE SEEN   WBC, Wet Prep HPF POC <10 <10   Sperm NONE SEEN     US OB LESS THAN 14 WEEKS WITH OB TRANSVAGINAL  Result Date: 09/08/2022 CLINICAL DATA:  Initial evaluation for vaginal bleeding, early pregnancy. EXAM: OBSTETRIC <14 WK [place] AND TRANSVAGINAL OB US TECHNIQUE: Both transabdominal and transvaginal ultrasound examinations were performed for complete evaluation of the gestation as well as the maternal uterus, adnexal regions, and pelvic cul-de-sac. Transvaginal technique was performed to assess early pregnancy. COMPARISON:  None Available. FINDINGS: Intrauterine gestational sac: Single Yolk sac:  Negative. Embryo:  Negative. Cardiac Activity: Negative. Heart Rate: N/A MSD: 5.5 mm   5 w   2 d Subchorionic hemorrhage: Anechoic collection concerning for a  small to moderate subchorionic hemorrhage. No significant mass effect. Maternal uterus/adnexae: Ovaries within normal limits. 2.7 cm dominant follicle noted within the right ovary. No other adnexal mass or free fluid. IMPRESSION: 1. Probable early intrauterine gestational sac, but no yolk sac, fetal pole, or cardiac activity yet visualized. Recommend follow-up quantitative B-HCG levels and follow-up US in 14 days to confirm and assess viability. This recommendation follows SRU consensus guidelines: Diagnostic Criteria for Nonviable Pregnancy Early in the First Trimester. Malva Limes Med 2013; 865:7846-96. 2. Small to moderate subchorionic hemorrhage. Electronically Signed   By: Rise Mu M.D.   On: 09/08/2022 17:49    Imaging: I independent reviewed the images of the ultrasound.  Gestational sac measuring 5 weeks 2 days.  No embryo or yolk sac visualized.  She does have a small to moderate subchorionic hemorrhage.  No adnexal masses present.  A 1. First trimester bleeding   2. [redacted] weeks gestation of pregnancy      P Discharge from MAU in stable condition Repeat US in 2 weeks. Return precautions given  Levie Heritage, DO 09/08/2022 7:03 PM

## 2022-09-08 NOTE — MAU Note (Signed)
Toni Mitchell is a 32 y.o. at Unknown here in MAU reporting: +HPT Sat.  Today is having pain in abd, back and head.  Feeling weakness. Light pink spotting, first noted this morning. LMP: 6/20 Onset of complaint: today Also constipated Pain score: abd 6, bck 8, HA 6 Vitals:   09/08/22 1515  BP: 124/76  Pulse: 89  Resp: 16  Temp: 99.3 F (37.4 C)  SpO2: 99%      Lab orders placed from triage:

## 2022-09-10 LAB — GC/CHLAMYDIA PROBE AMP (~~LOC~~) NOT AT ARMC
Chlamydia: NEGATIVE
Comment: NEGATIVE
Comment: NORMAL
Neisseria Gonorrhea: NEGATIVE

## 2022-09-18 DIAGNOSIS — Z419 Encounter for procedure for purposes other than remedying health state, unspecified: Secondary | ICD-10-CM | POA: Diagnosis not present

## 2022-09-23 ENCOUNTER — Ambulatory Visit (HOSPITAL_COMMUNITY)
Admission: RE | Admit: 2022-09-23 | Discharge: 2022-09-23 | Disposition: A | Discharge status: Home / Self Care | Payer: Medicaid Other | Source: Ambulatory Visit | Attending: Family Medicine | Admitting: Family Medicine

## 2022-09-23 DIAGNOSIS — Z3A01 Less than 8 weeks gestation of pregnancy: Secondary | ICD-10-CM | POA: Diagnosis not present

## 2022-09-23 DIAGNOSIS — O209 Hemorrhage in early pregnancy, unspecified: Secondary | ICD-10-CM | POA: Insufficient documentation

## 2022-09-23 DIAGNOSIS — O208 Other hemorrhage in early pregnancy: Secondary | ICD-10-CM | POA: Diagnosis not present

## 2022-10-19 DIAGNOSIS — Z419 Encounter for procedure for purposes other than remedying health state, unspecified: Secondary | ICD-10-CM | POA: Diagnosis not present

## 2022-11-18 DIAGNOSIS — Z419 Encounter for procedure for purposes other than remedying health state, unspecified: Secondary | ICD-10-CM | POA: Diagnosis not present

## 2022-12-19 DIAGNOSIS — Z419 Encounter for procedure for purposes other than remedying health state, unspecified: Secondary | ICD-10-CM | POA: Diagnosis not present

## 2023-01-18 DIAGNOSIS — Z419 Encounter for procedure for purposes other than remedying health state, unspecified: Secondary | ICD-10-CM | POA: Diagnosis not present

## 2023-02-18 DIAGNOSIS — Z419 Encounter for procedure for purposes other than remedying health state, unspecified: Secondary | ICD-10-CM | POA: Diagnosis not present

## 2023-03-21 DIAGNOSIS — Z419 Encounter for procedure for purposes other than remedying health state, unspecified: Secondary | ICD-10-CM | POA: Diagnosis not present

## 2023-04-18 DIAGNOSIS — Z419 Encounter for procedure for purposes other than remedying health state, unspecified: Secondary | ICD-10-CM | POA: Diagnosis not present

## 2023-05-30 DIAGNOSIS — Z419 Encounter for procedure for purposes other than remedying health state, unspecified: Secondary | ICD-10-CM | POA: Diagnosis not present

## 2023-06-29 DIAGNOSIS — Z419 Encounter for procedure for purposes other than remedying health state, unspecified: Secondary | ICD-10-CM | POA: Diagnosis not present

## 2023-07-30 DIAGNOSIS — Z419 Encounter for procedure for purposes other than remedying health state, unspecified: Secondary | ICD-10-CM | POA: Diagnosis not present

## 2023-08-29 DIAGNOSIS — Z419 Encounter for procedure for purposes other than remedying health state, unspecified: Secondary | ICD-10-CM | POA: Diagnosis not present

## 2023-09-29 DIAGNOSIS — Z419 Encounter for procedure for purposes other than remedying health state, unspecified: Secondary | ICD-10-CM | POA: Diagnosis not present

## 2023-10-30 DIAGNOSIS — Z419 Encounter for procedure for purposes other than remedying health state, unspecified: Secondary | ICD-10-CM | POA: Diagnosis not present
